# Patient Record
Sex: Female | Born: 1955 | Race: White | Hispanic: No | Marital: Single | Smoking: Former smoker
Health system: Southern US, Community
[De-identification: ages and names within clinical notes are randomized; demographics above are authoritative.]

## PROBLEM LIST (undated history)

## (undated) DIAGNOSIS — E785 Hyperlipidemia, unspecified: Secondary | ICD-10-CM

## (undated) DIAGNOSIS — K219 Gastro-esophageal reflux disease without esophagitis: Secondary | ICD-10-CM

## (undated) DIAGNOSIS — F319 Bipolar disorder, unspecified: Secondary | ICD-10-CM

## (undated) DIAGNOSIS — S069X9A Unspecified intracranial injury with loss of consciousness of unspecified duration, initial encounter: Secondary | ICD-10-CM

## (undated) DIAGNOSIS — S069XAA Unspecified intracranial injury with loss of consciousness status unknown, initial encounter: Secondary | ICD-10-CM

## (undated) DIAGNOSIS — I10 Essential (primary) hypertension: Secondary | ICD-10-CM

## (undated) HISTORY — PX: ABDOMINAL HYSTERECTOMY: SHX81

---

## 2004-10-28 ENCOUNTER — Ambulatory Visit: Payer: Self-pay | Admitting: Internal Medicine

## 2006-02-14 ENCOUNTER — Ambulatory Visit: Payer: Self-pay | Admitting: Internal Medicine

## 2015-06-30 ENCOUNTER — Other Ambulatory Visit: Payer: Self-pay | Admitting: *Deleted

## 2015-06-30 DIAGNOSIS — Z1231 Encounter for screening mammogram for malignant neoplasm of breast: Secondary | ICD-10-CM

## 2015-07-14 ENCOUNTER — Ambulatory Visit: Payer: Self-pay | Attending: Family Medicine

## 2016-03-15 ENCOUNTER — Ambulatory Visit
Admission: RE | Admit: 2016-03-15 | Discharge: 2016-03-15 | Disposition: A | Discharge status: Home / Self Care | Payer: Medicare Other | Source: Ambulatory Visit | Attending: Family Medicine | Admitting: Family Medicine

## 2016-03-15 DIAGNOSIS — Z1231 Encounter for screening mammogram for malignant neoplasm of breast: Secondary | ICD-10-CM | POA: Diagnosis not present

## 2018-08-09 ENCOUNTER — Ambulatory Visit: Payer: Medicare Other | Admitting: Podiatry

## 2020-07-07 ENCOUNTER — Other Ambulatory Visit: Payer: Self-pay | Admitting: Acute Care

## 2020-07-07 DIAGNOSIS — I639 Cerebral infarction, unspecified: Secondary | ICD-10-CM

## 2020-07-22 ENCOUNTER — Ambulatory Visit: Payer: Medicare Other

## 2020-08-06 ENCOUNTER — Ambulatory Visit: Admission: RE | Admit: 2020-08-06 | Payer: Medicare Other | Source: Ambulatory Visit

## 2020-09-07 ENCOUNTER — Ambulatory Visit: Payer: Medicare Other

## 2020-11-09 ENCOUNTER — Emergency Department: Payer: Medicare Other

## 2020-11-09 ENCOUNTER — Encounter: Payer: Self-pay | Admitting: Internal Medicine

## 2020-11-09 ENCOUNTER — Inpatient Hospital Stay
Admission: EM | Admit: 2020-11-09 | Discharge: 2020-11-18 | DRG: 871 | Disposition: A | Discharge status: Skilled Nursing Facility | Payer: Medicare Other | Attending: Internal Medicine | Admitting: Internal Medicine

## 2020-11-09 DIAGNOSIS — Z87820 Personal history of traumatic brain injury: Secondary | ICD-10-CM

## 2020-11-09 DIAGNOSIS — K219 Gastro-esophageal reflux disease without esophagitis: Secondary | ICD-10-CM | POA: Diagnosis present

## 2020-11-09 DIAGNOSIS — E669 Obesity, unspecified: Secondary | ICD-10-CM | POA: Diagnosis present

## 2020-11-09 DIAGNOSIS — F319 Bipolar disorder, unspecified: Secondary | ICD-10-CM | POA: Diagnosis present

## 2020-11-09 DIAGNOSIS — J189 Pneumonia, unspecified organism: Secondary | ICD-10-CM | POA: Diagnosis present

## 2020-11-09 DIAGNOSIS — Z6838 Body mass index (BMI) 38.0-38.9, adult: Secondary | ICD-10-CM

## 2020-11-09 DIAGNOSIS — F419 Anxiety disorder, unspecified: Secondary | ICD-10-CM | POA: Diagnosis present

## 2020-11-09 DIAGNOSIS — S069XAA Unspecified intracranial injury with loss of consciousness status unknown, initial encounter: Secondary | ICD-10-CM | POA: Insufficient documentation

## 2020-11-09 DIAGNOSIS — I10 Essential (primary) hypertension: Secondary | ICD-10-CM | POA: Diagnosis present

## 2020-11-09 DIAGNOSIS — E785 Hyperlipidemia, unspecified: Secondary | ICD-10-CM | POA: Diagnosis present

## 2020-11-09 DIAGNOSIS — A419 Sepsis, unspecified organism: Secondary | ICD-10-CM | POA: Diagnosis present

## 2020-11-09 DIAGNOSIS — R4182 Altered mental status, unspecified: Secondary | ICD-10-CM

## 2020-11-09 DIAGNOSIS — R52 Pain, unspecified: Secondary | ICD-10-CM | POA: Diagnosis present

## 2020-11-09 DIAGNOSIS — N3000 Acute cystitis without hematuria: Secondary | ICD-10-CM

## 2020-11-09 DIAGNOSIS — G40909 Epilepsy, unspecified, not intractable, without status epilepticus: Secondary | ICD-10-CM | POA: Diagnosis present

## 2020-11-09 DIAGNOSIS — Z20822 Contact with and (suspected) exposure to covid-19: Secondary | ICD-10-CM | POA: Diagnosis present

## 2020-11-09 DIAGNOSIS — N390 Urinary tract infection, site not specified: Secondary | ICD-10-CM | POA: Diagnosis present

## 2020-11-09 DIAGNOSIS — S069X9A Unspecified intracranial injury with loss of consciousness of unspecified duration, initial encounter: Secondary | ICD-10-CM | POA: Insufficient documentation

## 2020-11-09 DIAGNOSIS — G9341 Metabolic encephalopathy: Secondary | ICD-10-CM | POA: Diagnosis present

## 2020-11-09 DIAGNOSIS — Z87891 Personal history of nicotine dependence: Secondary | ICD-10-CM

## 2020-11-09 DIAGNOSIS — R569 Unspecified convulsions: Secondary | ICD-10-CM | POA: Diagnosis not present

## 2020-11-09 DIAGNOSIS — J9601 Acute respiratory failure with hypoxia: Secondary | ICD-10-CM | POA: Diagnosis present

## 2020-11-09 DIAGNOSIS — R269 Unspecified abnormalities of gait and mobility: Secondary | ICD-10-CM | POA: Diagnosis present

## 2020-11-09 DIAGNOSIS — D72829 Elevated white blood cell count, unspecified: Secondary | ICD-10-CM

## 2020-11-09 HISTORY — DX: Hyperlipidemia, unspecified: E78.5

## 2020-11-09 HISTORY — DX: Gastro-esophageal reflux disease without esophagitis: K21.9

## 2020-11-09 HISTORY — DX: Unspecified intracranial injury with loss of consciousness of unspecified duration, initial encounter: S06.9X9A

## 2020-11-09 HISTORY — DX: Bipolar disorder, unspecified: F31.9

## 2020-11-09 HISTORY — DX: Unspecified intracranial injury with loss of consciousness status unknown, initial encounter: S06.9XAA

## 2020-11-09 HISTORY — DX: Essential (primary) hypertension: I10

## 2020-11-09 LAB — URINALYSIS, COMPLETE (UACMP) WITH MICROSCOPIC
Bilirubin Urine: NEGATIVE
Glucose, UA: NEGATIVE mg/dL
Ketones, ur: 5 mg/dL — AB
Nitrite: NEGATIVE
Protein, ur: 100 mg/dL — AB
Specific Gravity, Urine: 1.015 (ref 1.005–1.030)
WBC, UA: >50 WBC/hpf — ABNORMAL HIGH (ref 0–5)
pH: 5 (ref 5.0–8.0)

## 2020-11-09 LAB — COMPREHENSIVE METABOLIC PANEL
ALT: 17 U/L (ref 0–44)
AST: 38 U/L (ref 15–41)
Albumin: 3.1 g/dL — ABNORMAL LOW (ref 3.5–5.0)
Alkaline Phosphatase: 51 U/L (ref 38–126)
Anion gap: 10 (ref 5–15)
BUN: 15 mg/dL (ref 8–23)
CO2: 25 mmol/L (ref 22–32)
Calcium: 8.8 mg/dL — ABNORMAL LOW (ref 8.9–10.3)
Chloride: 100 mmol/L (ref 98–111)
Creatinine, Ser: 1.18 mg/dL — ABNORMAL HIGH (ref 0.44–1.00)
GFR, Estimated: 52 mL/min — ABNORMAL LOW (ref >60)
Glucose, Bld: 128 mg/dL — ABNORMAL HIGH (ref 70–99)
Potassium: 3.7 mmol/L (ref 3.5–5.1)
Sodium: 135 mmol/L (ref 135–145)
Total Bilirubin: 0.9 mg/dL (ref 0.3–1.2)
Total Protein: 6.7 g/dL (ref 6.5–8.1)

## 2020-11-09 LAB — APTT: aPTT: 33 seconds (ref 24–36)

## 2020-11-09 LAB — CBC WITH DIFFERENTIAL/PLATELET
Abs Immature Granulocytes: 0.23 10*3/uL — ABNORMAL HIGH (ref 0.00–0.07)
Basophils Absolute: 0.1 10*3/uL (ref 0.0–0.1)
Basophils Relative: 0 %
Eosinophils Absolute: 0 10*3/uL (ref 0.0–0.5)
Eosinophils Relative: 0 %
HCT: 34.9 % — ABNORMAL LOW (ref 36.0–46.0)
Hemoglobin: 11.3 g/dL — ABNORMAL LOW (ref 12.0–15.0)
Immature Granulocytes: 1 %
Lymphocytes Relative: 5 %
Lymphs Abs: 0.9 10*3/uL (ref 0.7–4.0)
MCH: 28.5 pg (ref 26.0–34.0)
MCHC: 32.4 g/dL (ref 30.0–36.0)
MCV: 87.9 fL (ref 80.0–100.0)
Monocytes Absolute: 3.1 10*3/uL — ABNORMAL HIGH (ref 0.1–1.0)
Monocytes Relative: 17 %
Neutro Abs: 14.7 10*3/uL — ABNORMAL HIGH (ref 1.7–7.7)
Neutrophils Relative %: 77 %
Platelets: 211 10*3/uL (ref 150–400)
RBC: 3.97 MIL/uL (ref 3.87–5.11)
RDW: 13.7 % (ref 11.5–15.5)
Smear Review: NORMAL
WBC: 19 10*3/uL — ABNORMAL HIGH (ref 4.0–10.5)
nRBC: 0 % (ref 0.0–0.2)

## 2020-11-09 LAB — RESP PANEL BY RT-PCR (FLU A&B, COVID) ARPGX2
Influenza A by PCR: NEGATIVE
Influenza B by PCR: NEGATIVE
SARS Coronavirus 2 by RT PCR: NEGATIVE

## 2020-11-09 LAB — LACTIC ACID, PLASMA: Lactic Acid, Venous: 1.8 mmol/L (ref 0.5–1.9)

## 2020-11-09 LAB — PROTIME-INR
INR: 1.1 (ref 0.8–1.2)
Prothrombin Time: 14.1 seconds (ref 11.4–15.2)

## 2020-11-09 LAB — BRAIN NATRIURETIC PEPTIDE: B Natriuretic Peptide: 230 pg/mL — ABNORMAL HIGH (ref 0.0–100.0)

## 2020-11-09 MED ORDER — SODIUM CHLORIDE 0.9 % IV SOLN
2.0000 g | Freq: Once | INTRAVENOUS | Status: AC
  Administered 2020-11-09: 2 g via INTRAVENOUS
  Filled 2020-11-09: qty 2

## 2020-11-09 MED ORDER — SODIUM CHLORIDE 0.9 % IV BOLUS (SEPSIS)
1000.0000 mL | Freq: Once | INTRAVENOUS | Status: AC
  Administered 2020-11-09: 1000 mL via INTRAVENOUS

## 2020-11-09 MED ORDER — VITAMIN E 180 MG (400 UNIT) PO CAPS
400.0000 [IU] | ORAL_CAPSULE | Freq: Every day | ORAL | Status: DC
  Administered 2020-11-10 – 2020-11-18 (×9): 400 [IU] via ORAL
  Filled 2020-11-09 (×2): qty 4
  Filled 2020-11-09: qty 1
  Filled 2020-11-09 (×3): qty 4
  Filled 2020-11-09 (×2): qty 1
  Filled 2020-11-09 (×3): qty 4
  Filled 2020-11-09: qty 1

## 2020-11-09 MED ORDER — ENOXAPARIN SODIUM 40 MG/0.4ML ~~LOC~~ SOLN: 40.0000 mg | SUBCUTANEOUS | Status: DC

## 2020-11-09 MED ORDER — PRAVASTATIN SODIUM 40 MG PO TABS
40.0000 mg | ORAL_TABLET | Freq: Every day | ORAL | Status: DC
  Filled 2020-11-09: qty 1

## 2020-11-09 MED ORDER — VANCOMYCIN HCL IN DEXTROSE 1-5 GM/200ML-% IV SOLN
1000.0000 mg | Freq: Once | INTRAVENOUS | Status: DC
  Filled 2020-11-09: qty 200

## 2020-11-09 MED ORDER — THEREMS-M PO TABS: 1.0000 | ORAL_TABLET | Freq: Every day | ORAL | Status: DC

## 2020-11-09 MED ORDER — SODIUM CHLORIDE 0.9 % IV SOLN
500.0000 mg | INTRAVENOUS | Status: DC
  Administered 2020-11-09 – 2020-11-12 (×4): 500 mg via INTRAVENOUS
  Filled 2020-11-09 (×5): qty 500

## 2020-11-09 MED ORDER — SODIUM CHLORIDE 0.9 % IV SOLN
2.0000 g | Freq: Two times a day (BID) | INTRAVENOUS | Status: DC
  Administered 2020-11-10 – 2020-11-13 (×7): 2 g via INTRAVENOUS
  Filled 2020-11-09 (×2): qty 2
  Filled 2020-11-09: qty 1.94
  Filled 2020-11-09 (×6): qty 2

## 2020-11-09 MED ORDER — PRAVASTATIN SODIUM 40 MG PO TABS
40.0000 mg | ORAL_TABLET | Freq: Every day | ORAL | Status: DC
  Administered 2020-11-10 – 2020-11-17 (×8): 40 mg via ORAL
  Filled 2020-11-09 (×8): qty 2

## 2020-11-09 MED ORDER — BENZTROPINE MESYLATE 0.5 MG PO TABS
0.5000 mg | ORAL_TABLET | Freq: Two times a day (BID) | ORAL | Status: DC
  Administered 2020-11-09 – 2020-11-18 (×18): 0.5 mg via ORAL
  Filled 2020-11-09 (×21): qty 1

## 2020-11-09 MED ORDER — SODIUM CHLORIDE 0.9 % IV SOLN: 2.0000 g | Freq: Once | INTRAVENOUS | Status: DC

## 2020-11-09 MED ORDER — PANTOPRAZOLE SODIUM 40 MG PO TBEC
40.0000 mg | DELAYED_RELEASE_TABLET | Freq: Every day | ORAL | Status: DC
  Administered 2020-11-09 – 2020-11-18 (×10): 40 mg via ORAL
  Filled 2020-11-09 (×10): qty 1

## 2020-11-09 MED ORDER — RISPERIDONE 1 MG PO TABS
1.0000 mg | ORAL_TABLET | Freq: Two times a day (BID) | ORAL | Status: DC
  Administered 2020-11-09 – 2020-11-18 (×18): 1 mg via ORAL
  Filled 2020-11-09 (×20): qty 1

## 2020-11-09 MED ORDER — SODIUM CHLORIDE 0.9 % IV BOLUS
1000.0000 mL | Freq: Once | INTRAVENOUS | Status: AC
  Administered 2020-11-09: 1000 mL via INTRAVENOUS

## 2020-11-09 MED ORDER — METRONIDAZOLE IN NACL 5-0.79 MG/ML-% IV SOLN
500.0000 mg | Freq: Once | INTRAVENOUS | Status: AC
  Administered 2020-11-09: 500 mg via INTRAVENOUS
  Filled 2020-11-09: qty 100

## 2020-11-09 MED ORDER — ENOXAPARIN SODIUM 60 MG/0.6ML ~~LOC~~ SOLN
0.5000 mg/kg | SUBCUTANEOUS | Status: DC
  Administered 2020-11-09 – 2020-11-17 (×9): 50 mg via SUBCUTANEOUS
  Filled 2020-11-09 (×9): qty 0.6

## 2020-11-09 MED ORDER — ACETAMINOPHEN 500 MG PO TABS
ORAL_TABLET | ORAL | Status: AC
  Administered 2020-11-09: 1000 mg via ORAL
  Filled 2020-11-09: qty 2

## 2020-11-09 MED ORDER — VANCOMYCIN HCL 2000 MG/400ML IV SOLN
2000.0000 mg | Freq: Once | INTRAVENOUS | Status: DC
  Filled 2020-11-09: qty 400

## 2020-11-09 MED ORDER — VALPROATE SODIUM 100 MG/ML IV SOLN
1000.0000 mg | Freq: Every day | INTRAVENOUS | Status: DC
  Administered 2020-11-09 – 2020-11-10 (×2): 1000 mg via INTRAVENOUS
  Filled 2020-11-09 (×3): qty 10

## 2020-11-09 MED ORDER — VANCOMYCIN HCL IN DEXTROSE 1-5 GM/200ML-% IV SOLN
1000.0000 mg | Freq: Once | INTRAVENOUS | Status: AC
  Administered 2020-11-09: 1000 mg via INTRAVENOUS

## 2020-11-09 MED ORDER — LORAZEPAM 2 MG/ML IJ SOLN
1.0000 mg | INTRAMUSCULAR | Status: DC | PRN
  Administered 2020-11-09: 1 mg via INTRAVENOUS
  Filled 2020-11-09: qty 1

## 2020-11-09 MED ORDER — LORAZEPAM 0.5 MG PO TABS
1.0000 mg | ORAL_TABLET | Freq: Every day | ORAL | Status: DC
  Administered 2020-11-10 – 2020-11-18 (×9): 1 mg via ORAL
  Filled 2020-11-09 (×3): qty 1
  Filled 2020-11-09: qty 2
  Filled 2020-11-09 (×5): qty 1

## 2020-11-09 MED ORDER — ACETAMINOPHEN 325 MG PO TABS
650.0000 mg | ORAL_TABLET | Freq: Four times a day (QID) | ORAL | Status: DC | PRN
  Administered 2020-11-09 (×2): 650 mg via ORAL
  Filled 2020-11-09 (×2): qty 2

## 2020-11-09 MED ORDER — ALBUTEROL SULFATE (2.5 MG/3ML) 0.083% IN NEBU: 2.5000 mg | INHALATION_SOLUTION | RESPIRATORY_TRACT | Status: DC | PRN

## 2020-11-09 MED ORDER — HYDRALAZINE HCL 20 MG/ML IJ SOLN: 5.0000 mg | INTRAMUSCULAR | Status: DC | PRN

## 2020-11-09 MED ORDER — ACETAMINOPHEN 650 MG RE SUPP: 650.0000 mg | Freq: Four times a day (QID) | RECTAL | Status: DC | PRN

## 2020-11-09 MED ORDER — VITAMIN D3 25 MCG (1000 UNIT) PO TABS
2000.0000 [IU] | ORAL_TABLET | Freq: Every day | ORAL | Status: DC
  Administered 2020-11-09 – 2020-11-18 (×10): 2000 [IU] via ORAL
  Filled 2020-11-09 (×20): qty 2

## 2020-11-09 MED ORDER — RISPERIDONE 3 MG PO TABS
4.0000 mg | ORAL_TABLET | Freq: Two times a day (BID) | ORAL | Status: DC
  Administered 2020-11-09 – 2020-11-18 (×17): 4 mg via ORAL
  Filled 2020-11-09 (×10): qty 1
  Filled 2020-11-09: qty 4
  Filled 2020-11-09 (×11): qty 1

## 2020-11-09 MED ORDER — ADULT MULTIVITAMIN W/MINERALS CH
1.0000 | ORAL_TABLET | Freq: Every day | ORAL | Status: DC
  Administered 2020-11-10 – 2020-11-18 (×9): 1 via ORAL
  Filled 2020-11-09 (×9): qty 1

## 2020-11-09 MED ORDER — ONDANSETRON HCL 4 MG/2ML IJ SOLN: 4.0000 mg | Freq: Three times a day (TID) | INTRAMUSCULAR | Status: DC | PRN

## 2020-11-09 MED ORDER — ACETAMINOPHEN 500 MG PO TABS: 1000.0000 mg | ORAL_TABLET | Freq: Once | ORAL | Status: AC

## 2020-11-09 MED ORDER — LORATADINE 10 MG PO TABS
10.0000 mg | ORAL_TABLET | Freq: Every day | ORAL | Status: DC
  Administered 2020-11-09 – 2020-11-18 (×10): 10 mg via ORAL
  Filled 2020-11-09 (×10): qty 1

## 2020-11-09 MED ORDER — VANCOMYCIN HCL 1500 MG/300ML IV SOLN
1500.0000 mg | Freq: Once | INTRAVENOUS | Status: AC
  Administered 2020-11-09: 1500 mg via INTRAVENOUS
  Filled 2020-11-09: qty 300

## 2020-11-09 MED ORDER — LACTATED RINGERS IV SOLN: INTRAVENOUS | Status: DC

## 2020-11-09 MED ORDER — DM-GUAIFENESIN ER 30-600 MG PO TB12
1.0000 | ORAL_TABLET | Freq: Two times a day (BID) | ORAL | Status: DC | PRN
  Administered 2020-11-13: 1 via ORAL
  Filled 2020-11-09: qty 1

## 2020-11-09 NOTE — Consult Note (Signed)
Pharmacy Antibiotic Note  Debra Rich is a 65 y.o. female admitted on 11/09/2020 from group home with altered mental status and fever. Patient with history of cognitive deficiency and seizure-like activity. Patient presented with leukocytosis and fever. Pharmacy has been consulted for cefepime for CAP and UTI.  PCN allergy documentation in chart review: Hives, attempted to clarify with patient if any severe reaction, unable to obtain information from patient due to poor historian, AMS. Tolerated 1st dose of cefepime given in ED  Plan:  Initiate cefepime 2 gram Q12H based on current renal function  Monitor renal function for dose adjustments   Height: 5\' 3"  (160 cm) Weight: 99.8 kg (220 lb) IBW/kg (Calculated) : 52.4  Temp (24hrs), Avg:101.6 F (38.7 C), Min:99.4 F (37.4 C), Max:103 F (39.4 C)  Recent Labs  Lab 11/09/20 1200  WBC 19.0*  CREATININE 1.18*  LATICACIDVEN 1.8    Estimated Creatinine Clearance: 55.7 mL/min (A) (by C-G formula based on SCr of 1.18 mg/dL (H)).    Allergies  Allergen Reactions  . Penicillins     Antimicrobials this admission: 3/14 cefepime >>  3/14 azithromycin >>  3/14 Vancomycin x 1   Dose adjustments this admission: n/a  Microbiology results: 3/14 BCx: sent 3/14 UCx: sent   Thank you for allowing pharmacy to be a part of this patient's care.  4/14, PharmD, BCPS Clinical Pharmacist  11/09/2020 3:59 PM

## 2020-11-09 NOTE — ED Notes (Addendum)
Patient becomes anxious at times and has tremors which affects her vital signs. Patient has tried to get up to use the bathroom, but stays on the stretcher when reminded she has a Purewick. Purewick is in place and urine is noted in the cannister.

## 2020-11-09 NOTE — ED Notes (Signed)
Patient's dinner tray was set up for patient.

## 2020-11-09 NOTE — Progress Notes (Signed)
CODE SEPSIS - PHARMACY COMMUNICATION  **Broad Spectrum Antibiotics should be administered within 1 hour of Sepsis diagnosis**  Time Code Sepsis Called/Page Received: 1206  Antibiotics Ordered: vancomycin, aztreonam, metronidazole   Time of 1st antibiotic administration: 1214  Additional action taken by pharmacy: n/a  If necessary, Name of Provider/Nurse Contacted: n/a    Sharen Hones ,PharmD Clinical Pharmacist  11/09/2020  12:06 PM

## 2020-11-09 NOTE — ED Notes (Signed)
Per group home manager at bedside, states patient ambulates with walker intermittently, patient was able to walk to breakfast this morning without walker but started feeling bad during breakfast, patient ambulated to couch and then became too weak to walk and less responsive. Group home- [facility], contact Tora Perches [phone]

## 2020-11-09 NOTE — ED Notes (Signed)
MD at bedside. 

## 2020-11-09 NOTE — ED Provider Notes (Signed)
[facility] Emergency Department Provider Note  Time seen: 12:12 PM  I have reviewed the triage vital signs and the nursing notes.   HISTORY  Chief Complaint Altered Mental Status and Fever   HPI Jimmy Stipes is a 65 y.o. female with no known past medical history from a group home presents to the emergency department for confusion and fever.  According to report patient was noted to be confused and sluggish today found to be febrile to 103.  Here patient is awake and alert.  She is oriented to person and place but stated the year of 2002.  Patient's responses are somewhat slow/sluggish but overall accurate.  Patient denies any headache nausea vomiting diarrhea, dysuria, cough or congestion.  Patient states she has been vaccinated against COVID.  Denies any chest or abdominal pain.   No past medical history on file.  There are no problems to display for this patient.   No past surgical history on file.  Prior to Admission medications   Not on File    Allergies  Allergen Reactions  . Penicillins     Family History  Problem Relation Age of Onset  . Breast cancer Neg Hx     Social History    Review of Systems Constitutional: Positive for fever, patient believes x3 days. Cardiovascular: Negative for chest pain. Respiratory: Negative for shortness of breath.  Negative for cough. Gastrointestinal: Negative for abdominal pain, vomiting and diarrhea. Genitourinary: Negative for urinary compaints Musculoskeletal: Negative for musculoskeletal complaints Skin: Negative for skin complaints  Neurological: Negative for headache All other ROS negative  ____________________________________________   PHYSICAL EXAM:  VITAL SIGNS: ED Triage Vitals  Enc Vitals Group     BP 11/09/20 1155 (!) 140/110     Pulse Rate 11/09/20 1155 (!) 105     Resp 11/09/20 1157 (!) 24     Temp 11/09/20 1155 (!) 103 F (39.4 C)     Temp Source 11/09/20 1155 Oral     SpO2  11/09/20 1155 92 %     Weight 11/09/20 1155 220 lb (99.8 kg)     Height 11/09/20 1155 5\' 3"  (1.6 m)     Head Circumference --      Peak Flow --      Pain Score 11/09/20 1155 0     Pain Loc --      Pain Edu? --      Excl. in GC? --     Constitutional: Patient is awake and alert, no acute distress.  Oriented to person and place only.  Somewhat slowed but overall accurate responses. Eyes: Normal exam ENT      Head: Normocephalic and atraumatic.      Mouth/Throat: Dry mucous membranes. Cardiovascular: Normal rate, regular rhythm. No murmur Respiratory: Normal respiratory effort without tachypnea nor retractions. Breath sounds are clear Gastrointestinal: Soft and nontender. No distention.   Musculoskeletal: Nontender with normal range of motion in all extremities.  Neurologic:  Normal speech and language. No gross focal neurologic deficits  Skin:  Skin is warm, dry and intact.  Psychiatric: Mood and affect are normal.   ____________________________________________    EKG  EKG viewed and interpreted by myself appears to show a sinus rhythm 100 bpm with a narrow QRS, normal axis, nonspecific ST changes.  ____________________________________________    RADIOLOGY  IMPRESSION:  1. Suspect heterogeneous airspace opacity at the left lung base,  concerning for infection. PA and lateral radiographs may be helpful  to further evaluate.  2.  Mild cardiomegaly.   ____________________________________________   INITIAL IMPRESSION / ASSESSMENT AND PLAN / ED COURSE  Pertinent labs & imaging results that were available during my care of the patient were reviewed by me and considered in my medical decision making (see chart for details).   Patient presents emergency department for altered mental status confusion found to be febrile to 103.  Differential is quite broad at this time with an overall negative review of systems.  We will check labs, cultures, start broad-spectrum antibiotics, IV  hydrate and treat with Tylenol.  We will obtain Covid swabs addition to lab work and a chest x-ray.  Patient's labs show significant leukocytosis along with her borderline hypoxia tachypnea and tachycardia initially meeting sepsis criteria.  Patient receiving broad-spectrum antibiotics.  Chest x-ray shows left lung base pneumonia and urinalysis consistent with UTI.  Given the patient's confusion fever and sepsis criteria we will admit to the hospital service for continued IV antibiotics.  Patient agreeable to plan of care.  Jimma Ortman was evaluated in Emergency Department on 11/09/2020 for the symptoms described in the history of present illness. She was evaluated in the context of the global COVID-19 pandemic, which necessitated consideration that the patient might be at risk for infection with the SARS-CoV-2 virus that causes COVID-19. Institutional protocols and algorithms that pertain to the evaluation of patients at risk for COVID-19 are in a state of rapid change based on information released by regulatory bodies including the CDC and federal and state organizations. These policies and algorithms were followed during the patient's care in the ED.  CRITICAL CARE Performed by: Minna Antis   Total critical care time: 30 minutes  Critical care time was exclusive of separately billable procedures and treating other patients.  Critical care was necessary to treat or prevent imminent or life-threatening deterioration.  Critical care was time spent personally by me on the following activities: development of treatment plan with patient and/or surrogate as well as nursing, discussions with consultants, evaluation of patient's response to treatment, examination of patient, obtaining history from patient or surrogate, ordering and performing treatments and interventions, ordering and review of laboratory studies, ordering and review of radiographic studies, pulse oximetry and re-evaluation of  patient's condition.  ____________________________________________   FINAL CLINICAL IMPRESSION(S) / ED DIAGNOSES  Sepsis   Minna Antis, MD 11/09/20 1454

## 2020-11-09 NOTE — H&P (Signed)
History and Physical    Celes Dedic XLK:[id number] DOB: Jan 04, 1956 DOA: 11/09/2020  Referring MD/NP/PA:   PCP: System, [name]   Patient coming from:  The patient is coming from group home.   Chief Complaint: AMS and fever  HPI: Landi Biscardi is a 65 y.o. female with medical history significant of TBI, seizure-like activity (per Dr. Malvin Johns of neurology clinic note on 07/17/20), hypertension, bipolar disorder hyperlipidemia, GERD, anxiety, who presents with altered mental status and fever.  Per report, pt was noted to be more confused when he was sitting down on couch post breakfast. Patient would not speak to staff. Patient was noted to be febrile at 103 F.  Her mental status has been improving in ED. When I saw patient in the ED, patient is alert, mildly confused, but is orientated x3.  She was able to tell me some of family medical history.  She moves all extremities normally.  No facial droop or slurred speech. She denies chest pain, cough, shortness breath.  No nausea vomiting, diarrhea, abdominal pain, symptoms of UTI. She has fine tremor in both hands.  ED Course: pt was found to have WBC 19.0, negative Covid PCR, lactic acid of 1.8, INR 1.1, positive urinalysis (cloudy appearance, moderate amount of leukocyte, rare bacteria, WBC> 50), mild AKI with creatinine 1.08, BUN 15, GFR 52, temperature 103, blood pressure 129/81, heart rate 105, 32, oxygen desaturated to 89% on room air and 96% on 2 L oxygen. chest x-ray showed left basilar infiltration.  Patient is admitted to MedSurg bed as inpatient.  Review of Systems:   General: has fevers, no chills, no body weight gain, has fatigue HEENT: no blurry vision, hearing changes or sore throat Respiratory: no dyspnea, coughing, wheezing CV: no chest pain, no palpitations GI: no nausea, vomiting, abdominal pain, diarrhea, constipation GU: no dysuria, burning on urination, increased urinary frequency, hematuria  Ext: has leg edema Neuro:  no unilateral weakness, numbness, or tingling, no vision change or hearing loss. Has confusion.  Has hand tremor Skin: no rash, no skin tear. MSK: No muscle spasm, no deformity, no limitation of range of movement in spin Heme: No easy bruising.  Travel history: No recent long distant travel.  Allergy:  Allergies  Allergen Reactions  . Penicillins     Past Medical History:  Diagnosis Date  . Bipolar 1 disorder (HCC)   . GERD (gastroesophageal reflux disease)   . HLD (hyperlipidemia)   . HTN (hypertension)   . TBI (traumatic brain injury) [facility])     Past Surgical History:  Procedure Laterality Date  . ABDOMINAL HYSTERECTOMY     pt reported, but per group home manager, this may not be accurate.    Social History:  reports that she has quit smoking. She has never used smokeless tobacco. She reports that she does not drink alcohol and does not use drugs.  Family History:  Per patient's report.  Family History  Problem Relation Age of Onset  . Diabetes Mellitus II Mother   . Hypertension Mother   . Emphysema Father   . Breast cancer Neg Hx      Prior to Admission medications   Not on File    Physical Exam: Vitals:   11/09/20 1400 11/09/20 1412 11/09/20 1430 11/09/20 1431  BP: 101/67  129/81   Pulse: 88  81   Resp: (!) 29  (!) 25   Temp:  99.4 F (37.4 C)    TempSrc:  Oral    SpO2: 91%  90% (!) 89%  Weight:      Height:       General: Not in acute distress HEENT:       Eyes: PERRL, EOMI, no scleral icterus.       ENT: No discharge from the ears and nose, no pharynx injection, no tonsillar enlargement.        Neck: No JVD, no bruit, no mass felt. Heme: No neck lymph node enlargement. Cardiac: S1/S2, RRR, No murmurs, No gallops or rubs. Respiratory: Has coarse breathing sound bilaterally GI: Soft, nondistended, nontender, no rebound pain, no organomegaly, BS present. GU: No hematuria Ext: has trace leg edema bilaterally. 1+DP/PT pulse  bilaterally. Musculoskeletal: No joint deformities, No joint redness or warmth, no limitation of ROM in spin. Skin: No rashes.  Neuro: Currently mildly confused, but is orientated x3, cranial nerves II-XII grossly intact, moves all extremities normally.  Psych: Patient is not psychotic, no suicidal or hemocidal ideation.  Labs on Admission: I have personally reviewed following labs and imaging studies  CBC: Recent Labs  Lab 11/09/20 1200  WBC 19.0*  NEUTROABS 14.7*  HGB 11.3*  HCT 34.9*  MCV 87.9  PLT 211   Basic Metabolic Panel: Recent Labs  Lab 11/09/20 1200  NA 135  K 3.7  CL 100  CO2 25  GLUCOSE 128*  BUN 15  CREATININE 1.18*  CALCIUM 8.8*   GFR: Estimated Creatinine Clearance: 55.7 mL/min (A) (by C-G formula based on SCr of 1.18 mg/dL (H)). Liver Function Tests: Recent Labs  Lab 11/09/20 1200  AST 38  ALT 17  ALKPHOS 51  BILITOT 0.9  PROT 6.7  ALBUMIN 3.1*   No results for input(s): LIPASE, AMYLASE in the last 168 hours. No results for input(s): AMMONIA in the last 168 hours. Coagulation Profile: Recent Labs  Lab 11/09/20 1200  INR 1.1   Cardiac Enzymes: No results for input(s): CKTOTAL, CKMB, CKMBINDEX, TROPONINI in the last 168 hours. BNP (last 3 results) No results for input(s): PROBNP in the last 8760 hours. HbA1C: No results for input(s): HGBA1C in the last 72 hours. CBG: No results for input(s): GLUCAP in the last 168 hours. Lipid Profile: No results for input(s): CHOL, HDL, LDLCALC, TRIG, CHOLHDL, LDLDIRECT in the last 72 hours. Thyroid Function Tests: No results for input(s): TSH, T4TOTAL, FREET4, T3FREE, THYROIDAB in the last 72 hours. Anemia Panel: No results for input(s): VITAMINB12, FOLATE, FERRITIN, TIBC, IRON, RETICCTPCT in the last 72 hours. Urine analysis:    Component Value Date/Time   COLORURINE YELLOW (A) 11/09/2020 1159   APPEARANCEUR CLOUDY (A) 11/09/2020 1159   LABSPEC 1.015 11/09/2020 1159   PHURINE 5.0 11/09/2020  1159   GLUCOSEU NEGATIVE 11/09/2020 1159   HGBUR MODERATE (A) 11/09/2020 1159   BILIRUBINUR NEGATIVE 11/09/2020 1159   KETONESUR 5 (A) 11/09/2020 1159   PROTEINUR 100 (A) 11/09/2020 1159   NITRITE NEGATIVE 11/09/2020 1159   LEUKOCYTESUR MODERATE (A) 11/09/2020 1159   Sepsis Labs: @LABRCNTIP (procalcitonin:4,lacticidven:4) ) Recent Results (from the past 240 hour(s))  Resp Panel by RT-PCR (Flu A&B, Covid) Nasopharyngeal Swab     Status: None   Collection Time: 11/09/20 12:23 PM   Specimen: Nasopharyngeal Swab; [name]    Comment: (NOTE) SARS-CoV-2 target nucleic acids are NOT DETECTED.  The SARS-CoV-2 RNA is generally detectable in upper respiratory specimens during the acute phase of infection. The lowest concentration of SARS-CoV-2 viral  copies this assay can detect is 138 copies/mL. A negative result does not preclude SARS-Cov-2 infection and should not be used as the sole basis for treatment or other patient management decisions. A negative result may occur with  improper specimen collection/handling, submission of specimen other than nasopharyngeal swab, presence of viral mutation(s) within the areas targeted by this assay, and inadequate number of viral copies(<138 copies/mL). A negative result must be combined with clinical observations, patient history, and epidemiological information. The expected result is Negative.  Fact Sheet for Patients:  BloggerCourse.com  Fact Sheet for Healthcare Providers:  SeriousBroker.it  This test is no t yet approved or cleared by the [place] FDA and  has been authorized for detection and/or diagnosis of SARS-CoV-2 by FDA under an Emergency Use Authorization (EUA). This EUA will remain  in effect (meaning this test can be used) for the duration of  the COVID-19 declaration under Section 564(b)(1) of the Act, 21 U.S.C.section 360bbb-3(b)(1), unless the authorization is terminated  or revoked sooner.       Influenza A by PCR NEGATIVE NEGATIVE Final   Influenza B by PCR NEGATIVE NEGATIVE Final    Comment: (NOTE) The Xpert Xpress SARS-CoV-2/FLU/RSV plus assay is intended as an aid in the diagnosis of influenza from Nasopharyngeal swab specimens and should not be used as a sole basis for treatment. Nasal washings and aspirates are unacceptable for Xpert Xpress SARS-CoV-2/FLU/RSV testing.  Fact Sheet for Patients: BloggerCourse.com  Fact Sheet for Healthcare Providers: SeriousBroker.it  This test is not yet approved or cleared by the [place] FDA and has been authorized for detection and/or diagnosis of SARS-CoV-2 by FDA under an Emergency Use Authorization (EUA). This EUA will remain in effect (meaning this test can be used) for the duration of the COVID-19 declaration under Section 564(b)(1) of the Act, 21 U.S.C. section 360bbb-3(b)(1), unless the authorization is terminated or revoked.  Performed at [facility], [address], [place], [place] [address]      Radiological Exams on Admission: DG [facility] View  Result Date: 11/09/2020 CLINICAL DATA:  Suspected sepsis EXAM: PORTABLE CHEST 1 VIEW COMPARISON:  None. FINDINGS: Mild cardiomegaly. Suspect heterogeneous airspace opacity at the left lung base with loss of diaphragmatic distinction. The visualized skeletal structures are unremarkable. IMPRESSION: 1. Suspect heterogeneous airspace opacity at the left lung base, concerning for infection. PA and lateral radiographs may be helpful to further evaluate. 2. Mild cardiomegaly. Electronically Signed   By: Lauralyn Primes M.D.   On: 11/09/2020 12:23     EKG: I have personally reviewed.  Seems to be sinus rhythm with artificial reflux, QTC 451, low voltage, poor  R wave progression  Assessment/Plan Principal Problem:   CAP (community acquired pneumonia) Active Problems:   HTN (hypertension)   HLD (hyperlipidemia)   Seizure-like activity (HCC)   GERD (gastroesophageal reflux disease)   Sepsis (HCC)   Acute metabolic encephalopathy   Acute respiratory failure with hypoxia (HCC)   UTI (urinary tract infection)  Sepsis due to CAP and UTI: Patient meets criteria for sepsis with leukocytosis with WBC 19.0, tachycardia with heart rate of 105, tachypnea with RR 32 and a fever of 103.  Lactic acid is normal.  Currently hemodynamically stable  -Admitted to MedSurg bed for inpatient - IV cefepime and azithromycin (patient received 1 dose of vancomycin and Flagyl in ED) - Mucinex for cough  - Bronchodilators - Urine legionella and S. pneumococcal antigen - Follow up blood culture x2, sputum culture, urine culture -  will get Procalcitonin - IVF: 2L of NS bolus in ED, followed by 75 mL per hour of NS   Acute respiratory failure with hypoxia due to CAP. -Bronchodilators -Nasal cannula oxygen to maintain oxygen saturation above 93%  HTN (hypertension) -hold oral blood pressure medications (amlodipine, Cozaar) since patient is at high risk of developing hypotension due to sepsis -IV hydralazine as needed  HLD (hyperlipidemia) -Pravastatin  Seizure-like activity (HCC) -On Depakote, will switch to IV until mental status, back to normal  GERD (gastroesophageal reflux disease) -Protonix  Acute metabolic encephalopathy: Most likely due to sepsis and UTI -Frequent neuro check    DVT ppx:   SQ Lovenox Code Status: Full code per group home manager Family Communication: I spoke with group home manager Disposition Plan:  Anticipate discharge back to previous environment Consults called: None Admission status and Level of care: Med-Surg:   as inpt       Status is: Inpatient  Remains inpatient appropriate because:Inpatient level of care  appropriate due to severity of illness   Dispo: The patient is from: Group home              Anticipated d/c is to: Group home              Patient currently is not medically stable to d/c.   Difficult to place patient No         Date of Service 11/09/2020    Lorretta Harp [facility]   If 7PM-7AM, please contact night-coverage [email or web address] 11/09/2020, 4:30 PM

## 2020-11-09 NOTE — ED Notes (Signed)
Purewick was replaced after patient was incontinent to urine. Patient was transferred to a hospital bed for comfort and safety. Bed alarm is on. Patient was hot to the touch. Oral temp was 102.

## 2020-11-09 NOTE — ED Notes (Signed)
Patient was assisted to room commode. Patient was already incontinent to urine. Patient was cleaned, wet clothes were removed and ED briefs with a pad was put on patient per her request over a diaper. Patient was assisted back to the stretcher. Patient was unable to get back on the stretcher independently and was held up by this Clinical research associate. A call was made for assistance and patient was helped back on the stretcher. Purewick was placed. Patient was oriented to the call bell again.

## 2020-11-09 NOTE — ED Notes (Signed)
Report given to Zach RN 

## 2020-11-09 NOTE — Consult Note (Signed)
PHARMACY -  BRIEF ANTIBIOTIC NOTE   Pharmacy has received consult(s) for aztreonam and vancomycin from an ED provider.  The patient's profile has been reviewed for ht/wt/allergies/indication/available labs.    After review of PCN allergy severity, aztreonam changed to cefepime  One time order(s) placed for   Vancomycin 2500 mg   Cefepime 2 gram   Further antibiotics/pharmacy consults should be ordered by admitting physician if indicated.                       Thank you, Sharen Hones, PharmD, BCPS Clinical Pharmacist  11/09/2020  1:18 PM

## 2020-11-09 NOTE — Progress Notes (Signed)
PHARMACIST - PHYSICIAN COMMUNICATION  CONCERNING:  Enoxaparin (Lovenox) for DVT Prophylaxis    RECOMMENDATION: Patient was prescribed enoxaprin 40mg  q24 hours for VTE prophylaxis.   Filed Weights   11/09/20 1155  Weight: 99.8 kg (220 lb)    Body mass index is 38.97 kg/m.  Estimated Creatinine Clearance: 55.7 mL/min (A) (by C-G formula based on SCr of 1.18 mg/dL (H)).   Based on [facility] policy patient is candidate for enoxaparin 0.5mg /kg TBW SQ every 24 hours based on BMI being >30.  DESCRIPTION: Pharmacy has adjusted enoxaparin dose per [facility] policy.  Patient is now receiving enoxaparin 50 mg every 24 hours    [facility], PharmD Clinical Pharmacist  11/09/2020 6:46 PM

## 2020-11-09 NOTE — ED Notes (Signed)
Patient is able to feed self.

## 2020-11-09 NOTE — ED Triage Notes (Signed)
Patient presents to ER from group home. Per staff patient sat down on couch post breakfast and slowly became weak/altered. Patient would not speak to staff so they called EMS. Per EMS patient A&OX2 in route to ER. Patient noted to be febrile at 103 F. Patient A&OX2 on arrival to ER.

## 2020-11-09 NOTE — ED Notes (Signed)
Dr. Clyde Lundborg at bedside. Patient is alert, calm and cooperative. Patient is oriented to self and date, place.

## 2020-11-09 NOTE — Progress Notes (Signed)
Following for code sepsis 

## 2020-11-09 NOTE — ED Notes (Signed)
Patient ambulated to and from toilet with assistance, patient needs constant directions but did not appear to have weakness in legs.

## 2020-11-09 NOTE — ED Notes (Signed)
Patient was repositioned on the stretcher. Patient had called out that she needed to void. Urine was noted in the cannister on the wall. Two ED techs repositioned the patient on the stretcher and Purewick was checked and was in place.

## 2020-11-10 ENCOUNTER — Other Ambulatory Visit: Payer: Self-pay

## 2020-11-10 DIAGNOSIS — G9341 Metabolic encephalopathy: Secondary | ICD-10-CM | POA: Diagnosis not present

## 2020-11-10 DIAGNOSIS — J9601 Acute respiratory failure with hypoxia: Secondary | ICD-10-CM | POA: Diagnosis not present

## 2020-11-10 DIAGNOSIS — J189 Pneumonia, unspecified organism: Secondary | ICD-10-CM | POA: Diagnosis not present

## 2020-11-10 LAB — CBC
HCT: 32.7 % — ABNORMAL LOW (ref 36.0–46.0)
Hemoglobin: 10.3 g/dL — ABNORMAL LOW (ref 12.0–15.0)
MCH: 28.3 pg (ref 26.0–34.0)
MCHC: 31.5 g/dL (ref 30.0–36.0)
MCV: 89.8 fL (ref 80.0–100.0)
Platelets: 180 10*3/uL (ref 150–400)
RBC: 3.64 MIL/uL — ABNORMAL LOW (ref 3.87–5.11)
RDW: 14.1 % (ref 11.5–15.5)
WBC: 19 10*3/uL — ABNORMAL HIGH (ref 4.0–10.5)
nRBC: 0 % (ref 0.0–0.2)

## 2020-11-10 LAB — BASIC METABOLIC PANEL
Anion gap: 7 (ref 5–15)
BUN: 15 mg/dL (ref 8–23)
CO2: 25 mmol/L (ref 22–32)
Calcium: 8.2 mg/dL — ABNORMAL LOW (ref 8.9–10.3)
Chloride: 104 mmol/L (ref 98–111)
Creatinine, Ser: 0.97 mg/dL (ref 0.44–1.00)
GFR, Estimated: >60 mL/min (ref >60)
Glucose, Bld: 105 mg/dL — ABNORMAL HIGH (ref 70–99)
Potassium: 3.8 mmol/L (ref 3.5–5.1)
Sodium: 136 mmol/L (ref 135–145)

## 2020-11-10 LAB — HIV ANTIBODY (ROUTINE TESTING W REFLEX): HIV Screen 4th Generation wRfx: NONREACTIVE

## 2020-11-10 LAB — STREP PNEUMONIAE URINARY ANTIGEN: Strep Pneumo Urinary Antigen: NEGATIVE

## 2020-11-10 MED ORDER — ACETAMINOPHEN 500 MG PO TABS
1000.0000 mg | ORAL_TABLET | Freq: Four times a day (QID) | ORAL | Status: DC | PRN
  Administered 2020-11-10 – 2020-11-12 (×6): 1000 mg via ORAL
  Filled 2020-11-10 (×6): qty 2

## 2020-11-10 NOTE — Progress Notes (Signed)
11/09/20 2341 11/09/20 2359 11/10/20 0047  Assess: MEWS Score  Temp (!) 102.3 F (39.1 C)  --   --   BP 132/60  --   --   Pulse Rate 97  --   --   Resp (!) 22  --   --   Level of Consciousness  --   --   --   SpO2 97 %  --   --   O2 Device Nasal Cannula  --   --   Assess: MEWS Score  MEWS Temp 2  --   --   MEWS Systolic 0  --   --   MEWS Pulse 0  --   --   MEWS RR 1  --   --   MEWS LOC 0  --   --   MEWS Score 3  --   --   MEWS Score Color Yellow  --   --   Assess: if the MEWS score is Yellow or Red  Were vital signs taken at a resting state?  --  Yes  --   Focused Assessment  --  No change from prior assessment  --   Early Detection of Sepsis Score *See Row Information*  --  High  --   MEWS guidelines implemented *See Row Information*  --  No, previously yellow, continue vital signs every 4 hours  --   Treat  MEWS Interventions  --  Administered prn meds/treatments (PRN Tylenol)  --   Pain Scale  --  0-10  --   Pain Score  --  0  --   Patients response to intervention  --   --  Unchanged  Notify: Charge Nurse/RN  Name of Charge Nurse/RN Notified  --  Chiquita Loth, RN  --   Date Charge Nurse/RN Notified  --  11/10/20  --   Time Charge Nurse/RN Notified  --  0000  --   Document  Patient Outcome  --   --   --     11/10/20 0056 11/10/20 0100 11/10/20 0237  Assess: MEWS Score  Temp (!) 102.7 F (39.3 C)  --  (!) 100.9 F (38.3 C)  BP  --   --   --   Pulse Rate  --   --   --   Resp  --   --   --   Level of Consciousness  --  Alert  --   SpO2  --   --   --   O2 Device  --   --   --   Assess: MEWS Score  MEWS Temp 2 2 1   MEWS Systolic 0 0 0  MEWS Pulse 0 0 0  MEWS RR 1 1 1   MEWS LOC 0 0 0  MEWS Score 3 3 2   MEWS Score Color Yellow Yellow Yellow  Assess: if the MEWS score is Yellow or Red  Were vital signs taken at a resting state?  --   --   --   Focused Assessment  --   --   --   Early Detection of Sepsis Score *See Row Information*  --   --   --   MEWS  guidelines implemented *See Row Information*  --   --   --   Treat  MEWS Interventions  --   --   --   Pain Scale  --   --   --   Pain Score  --   --   --  Patients response to intervention  --   --   --   Notify: Charge Nurse/RN  Name of Charge Nurse/RN Notified  --   --   --   Date Charge Nurse/RN Notified  --   --   --   Time Charge Nurse/RN Notified  --   --   --   Document  Patient Outcome  --   --   --     11/10/20 0439 11/10/20 0548 11/10/20 0605  Assess: MEWS Score  Temp (!) 101.2 F (38.4 C) (!) 102.7 F (39.3 C)  --   BP (!) 110/49  --   --   Pulse Rate 89  --   --   Resp (!) 22  --   --   Level of Consciousness  --   --   --   SpO2 95 %  --   --   O2 Device Nasal Cannula  --   --   Assess: MEWS Score  MEWS Temp 1 2  --   MEWS Systolic 0 0  --   MEWS Pulse 0 0  --   MEWS RR 1 1  --   MEWS LOC 0 0  --   MEWS Score 2 3  --   MEWS Score Color Yellow Yellow  --   Assess: if the MEWS score is Yellow or Red  Were vital signs taken at a resting state?  --   --   --   Focused Assessment  --   --   --   Early Detection of Sepsis Score *See Row Information*  --   --   --   MEWS guidelines implemented *See Row Information*  --   --   --   Treat  MEWS Interventions  --   --   --   Pain Scale  --   --   --   Pain Score  --   --   --   Patients response to intervention  --   --  Increased  Notify: Charge Nurse/RN  Name of Charge Nurse/RN Notified  --   --   --   Date Charge Nurse/RN Notified  --   --   --   Time Charge Nurse/RN Notified  --   --   --   Document  Patient Outcome  --   --  Other (Comment) (Patient still has elevated temp. Will continue to monitor per NP)

## 2020-11-10 NOTE — Progress Notes (Signed)
PROGRESS NOTE    Debra Rich  ZBT:[id number] DOB: 05-Aug-1956 DOA: 11/09/2020 PCP: System, [name]   Chief complaint.  Altered mental status and fever. Brief Narrative:  Debra Rich is a 65 y.o. female with medical history significant of TBI, seizure-like activity (per Dr. Malvin Johns of neurology clinic note on 07/17/20), hypertension, bipolar disorder hyperlipidemia, GERD, anxiety, who presents with altered mental status and fever. Upon arriving the emergency room, patient met sepsis criteria with leukocytosis, tachycardia, tachypnea and mild hypoxemia.  Urine study was abnormal, chest x-ray showed left basilar infiltrates. Patient is started on with cefepime for UTI and pneumonia, she was also put down zithromax.   Assessment & Plan:   Principal Problem:   CAP (community acquired pneumonia) Active Problems:   HTN (hypertension)   HLD (hyperlipidemia)   Seizure-like activity (HCC)   GERD (gastroesophageal reflux disease)   Sepsis (HCC)   Acute metabolic encephalopathy   Acute respiratory failure with hypoxia (HCC)   UTI (urinary tract infection)  #1.  Sepsis.  Secondary to pneumonia and UTI. Left lower lobe pneumonia. Urinary tract infection. Acute metabolic encephalopathy. Traumatic brain injury. Patient is still confused today, sleepy.  Able to eat. Continue cefepime at the distal max for now. Pending culture results.  2.  Acute hypoxemic respiratory failure secondary to pneumonia. Continue oxygen.  #3.  Seizure disorder. Continues on home medicines.    DVT prophylaxis: Lovenox Code Status: Full Family Communication:  Disposition Plan:  .   Status is: Inpatient  Remains inpatient appropriate because:Inpatient level of care appropriate due to severity of illness   Dispo: The patient is from: Group home              Anticipated d/c is to: Group home              Patient currently is not medically stable to d/c.   Difficult to place patient No         I/O last 3 completed shifts: In: 1122.7 [I.V.:663.8; IV Piggyback:459] Out: -  No intake/output data recorded.     Consultants:   None  Procedures: None  Antimicrobials:  Cefepime and Zithromax.  Subjective: Patient is totally confused, she is not talking today.  She has low-grade fever up temperature 100.8. She has a cough, she is on 2 L oxygen. She has no abdominal pain nausea vomiting.   Objective: Vitals:   11/10/20 0800 11/10/20 1000 11/10/20 1149 11/10/20 1224  BP: (!) 111/49  121/62 112/75  Pulse: 75  88 90  Resp: (!) 26  18 (!) 26  Temp: 99.8 F (37.7 C) 98.3 F (36.8 C) (!) 100.8 F (38.2 C) 97.6 F (36.4 C)  TempSrc: Oral Oral Axillary Oral  SpO2: 93%  94% 98%  Weight:      Height:        Intake/Output Summary (Last 24 hours) at 11/10/2020 1236 Last data filed at 11/10/2020 0400 Gross per 24 hour  Intake 1122.71 ml  Output -  Net 1122.71 ml   Filed Weights   11/09/20 1155  Weight: 99.8 kg    Examination:  General exam: Appears calm and comfortable  Respiratory system: Clear to auscultation. Respiratory effort normal. Cardiovascular system: S1 & S2 heard, RRR. No JVD, murmurs, rubs, gallops or clicks. No pedal edema. Gastrointestinal system: Abdomen is nondistended, soft and nontender. No organomegaly or masses felt. Normal bowel sounds heard. Central nervous system: Drowsy and oriented x1. No focal neurological deficits. Extremities: Symmetric 5 x 5 power. Skin: No  rashes, lesions or ulcers      Data Reviewed: I have personally reviewed following labs and imaging studies  CBC: Recent Labs  Lab 11/09/20 1200 11/10/20 0526  WBC 19.0* 19.0*  NEUTROABS 14.7*  --   HGB 11.3* 10.3*  HCT 34.9* 32.7*  MCV 87.9 89.8  PLT 211 180   Basic Metabolic Panel: Recent Labs  Lab 11/09/20 1200 11/10/20 0526  NA 135 136  K 3.7 3.8  CL 100 104  CO2 25 25  GLUCOSE 128* 105*  BUN 15 15  CREATININE 1.18* 0.97  CALCIUM 8.8* 8.2*    GFR: Estimated Creatinine Clearance: 67.8 mL/min (by C-G formula based on SCr of 0.97 mg/dL). Liver Function Tests: Recent Labs  Lab 11/09/20 1200  AST 38  ALT 17  ALKPHOS 51  BILITOT 0.9  PROT 6.7  ALBUMIN 3.1*   No results for input(s): LIPASE, AMYLASE in the last 168 hours. No results for input(s): AMMONIA in the last 168 hours. Coagulation Profile: Recent Labs  Lab 11/09/20 1200  INR 1.1   Cardiac Enzymes: No results for input(s): CKTOTAL, CKMB, CKMBINDEX, TROPONINI in the last 168 hours. BNP (last 3 results) No results for input(s): PROBNP in the last 8760 hours. HbA1C: No results for input(s): HGBA1C in the last 72 hours. CBG: No results for input(s): GLUCAP in the last 168 hours. Lipid Profile: No results for input(s): CHOL, HDL, LDLCALC, TRIG, CHOLHDL, LDLDIRECT in the last 72 hours. Thyroid Function Tests: No results for input(s): TSH, T4TOTAL, FREET4, T3FREE, THYROIDAB in the last 72 hours. Anemia Panel: No results for input(s): VITAMINB12, FOLATE, FERRITIN, TIBC, IRON, RETICCTPCT in the last 72 hours. Sepsis Labs: Recent Labs  Lab 11/09/20 1200  LATICACIDVEN 1.8    Recent Results (from the past 240 hour(s))  Culture, blood (Routine x 2)     Status: None (Preliminary result)   Collection Time: 11/09/20 12:00 PM   Specimen: BLOOD  Result Value Ref Range Status   Specimen Description BLOOD LEFT ANTECUBITAL  Final   Special Requests   Final    BOTTLES DRAWN AEROBIC AND ANAEROBIC Blood Culture adequate volume   Culture   Final    NO GROWTH < 24 HOURS Performed at [facility], [address], [place], [place] [address]    Report Status PENDING  Incomplete  Culture, blood (Routine x 2)     Status: None (Preliminary result)   Collection Time: 11/09/20 12:00 PM   Specimen: BLOOD  Result Value Ref Range Status   Specimen Description BLOOD RIGHT ANTECUBITAL  Final   Special Requests   Final    BOTTLES DRAWN AEROBIC AND ANAEROBIC Blood  Culture adequate volume   Culture   Final    NO GROWTH < 24 HOURS Performed at [facility], [address], [place], [place] [address]    Report Status PENDING  Incomplete  Resp Panel by RT-PCR (Flu A&B, Covid) Nasopharyngeal Swab     Status: None   Collection Time: 11/09/20 12:23 PM   Specimen: Nasopharyngeal Swab; [name]    Comment: (NOTE) SARS-CoV-2 target nucleic acids are NOT DETECTED.  The SARS-CoV-2 RNA is generally detectable in upper respiratory specimens during the acute phase of infection. The lowest concentration of SARS-CoV-2 viral copies this assay can detect is 138 copies/mL. A negative result does not preclude SARS-Cov-2 infection and should not be used as  the sole basis for treatment or other patient management decisions. A negative result may occur with  improper specimen collection/handling, submission of specimen other than nasopharyngeal swab, presence of viral mutation(s) within the areas targeted by this assay, and inadequate number of viral copies(<138 copies/mL). A negative result must be combined with clinical observations, patient history, and epidemiological information. The expected result is Negative.  Fact Sheet for Patients:  BloggerCourse.com  Fact Sheet for Healthcare Providers:  SeriousBroker.it  This test is no t yet approved or cleared by the [place] FDA and  has been authorized for detection and/or diagnosis of SARS-CoV-2 by FDA under an Emergency Use Authorization (EUA). This EUA will remain  in effect (meaning this test can be used) for the duration of the COVID-19 declaration under Section 564(b)(1) of the Act, 21 U.S.C.section 360bbb-3(b)(1), unless the authorization is terminated  or revoked sooner.       Influenza A by PCR NEGATIVE  NEGATIVE Final   Influenza B by PCR NEGATIVE NEGATIVE Final    Comment: (NOTE) The Xpert Xpress SARS-CoV-2/FLU/RSV plus assay is intended as an aid in the diagnosis of influenza from Nasopharyngeal swab specimens and should not be used as a sole basis for treatment. Nasal washings and aspirates are unacceptable for Xpert Xpress SARS-CoV-2/FLU/RSV testing.  Fact Sheet for Patients: BloggerCourse.com  Fact Sheet for Healthcare Providers: SeriousBroker.it  This test is not yet approved or cleared by the [place] FDA and has been authorized for detection and/or diagnosis of SARS-CoV-2 by FDA under an Emergency Use Authorization (EUA). This EUA will remain in effect (meaning this test can be used) for the duration of the COVID-19 declaration under Section 564(b)(1) of the Act, 21 U.S.C. section 360bbb-3(b)(1), unless the authorization is terminated or revoked.  Performed at [facility] [place] [facility], [address], [place], [place] [address]          Radiology Studies: [facility] Chest [place] 1 View  Result Date: 11/09/2020 CLINICAL DATA:  Suspected sepsis EXAM: PORTABLE CHEST 1 VIEW COMPARISON:  None. FINDINGS: Mild cardiomegaly. Suspect heterogeneous airspace opacity at the left lung base with loss of diaphragmatic distinction. The visualized skeletal structures are unremarkable. IMPRESSION: 1. Suspect heterogeneous airspace opacity at the left lung base, concerning for infection. PA and lateral radiographs may be helpful to further evaluate. 2. Mild cardiomegaly. Electronically Signed   By: Lauralyn Primes M.D.   On: 11/09/2020 12:23        Scheduled Meds: . benztropine  0.5 mg Oral BID  . cholecalciferol  2,000 Units Oral Daily  . enoxaparin (LOVENOX) injection  0.5 mg/kg Subcutaneous Q24H  . loratadine  10 mg Oral Daily  . LORazepam  1 mg Oral Daily  . multivitamin with minerals  1 tablet Oral Daily  . pantoprazole  40 mg Oral  Daily  . pravastatin  40 mg Oral QHS  . risperiDONE  1 mg Oral BID  . risperidone  4 mg Oral BID  . vitamin E  400 Units Oral Daily   Continuous Infusions: . azithromycin Stopped (11/09/20 1836)  . ceFEPime (MAXIPIME) IV 2 g (11/10/20 0118)  . valproate sodium Stopped (11/09/20 2202)     LOS: 1 day    Time spent: 28 minutes    Marrion Coy, MD [facility]   To contact the attending provider between 7A-7P or the covering provider during after hours 7P-7A, please log into the web site [email or web address] and access using universal [place] password for that web site. If you do not  have the password, please call the hospital operator.  11/10/2020, 12:36 PM

## 2020-11-11 DIAGNOSIS — J189 Pneumonia, unspecified organism: Secondary | ICD-10-CM | POA: Diagnosis not present

## 2020-11-11 LAB — CBC WITH DIFFERENTIAL/PLATELET
Abs Immature Granulocytes: 0.12 10*3/uL — ABNORMAL HIGH (ref 0.00–0.07)
Basophils Absolute: 0 10*3/uL (ref 0.0–0.1)
Basophils Relative: 0 %
Eosinophils Absolute: 0 10*3/uL (ref 0.0–0.5)
Eosinophils Relative: 0 %
HCT: 29.8 % — ABNORMAL LOW (ref 36.0–46.0)
Hemoglobin: 9.2 g/dL — ABNORMAL LOW (ref 12.0–15.0)
Immature Granulocytes: 1 %
Lymphocytes Relative: 15 %
Lymphs Abs: 1.9 10*3/uL (ref 0.7–4.0)
MCH: 28 pg (ref 26.0–34.0)
MCHC: 30.9 g/dL (ref 30.0–36.0)
MCV: 90.9 fL (ref 80.0–100.0)
Monocytes Absolute: 1.8 10*3/uL — ABNORMAL HIGH (ref 0.1–1.0)
Monocytes Relative: 15 %
Neutro Abs: 8.3 10*3/uL — ABNORMAL HIGH (ref 1.7–7.7)
Neutrophils Relative %: 69 %
Platelets: 162 10*3/uL (ref 150–400)
RBC: 3.28 MIL/uL — ABNORMAL LOW (ref 3.87–5.11)
RDW: 13.7 % (ref 11.5–15.5)
WBC: 12.1 10*3/uL — ABNORMAL HIGH (ref 4.0–10.5)
nRBC: 0 % (ref 0.0–0.2)

## 2020-11-11 LAB — BASIC METABOLIC PANEL
Anion gap: 6 (ref 5–15)
BUN: 16 mg/dL (ref 8–23)
CO2: 27 mmol/L (ref 22–32)
Calcium: 8.2 mg/dL — ABNORMAL LOW (ref 8.9–10.3)
Chloride: 105 mmol/L (ref 98–111)
Creatinine, Ser: 0.96 mg/dL (ref 0.44–1.00)
GFR, Estimated: >60 mL/min (ref >60)
Glucose, Bld: 99 mg/dL (ref 70–99)
Potassium: 3.6 mmol/L (ref 3.5–5.1)
Sodium: 138 mmol/L (ref 135–145)

## 2020-11-11 LAB — LEGIONELLA PNEUMOPHILA SEROGP 1 UR AG: L. pneumophila Serogp 1 Ur Ag: NEGATIVE

## 2020-11-11 LAB — URINE CULTURE

## 2020-11-11 LAB — MAGNESIUM: Magnesium: 2.1 mg/dL (ref 1.7–2.4)

## 2020-11-11 MED ORDER — DIVALPROEX SODIUM 500 MG PO DR TAB
1000.0000 mg | DELAYED_RELEASE_TABLET | Freq: Every day | ORAL | Status: DC
  Administered 2020-11-11 – 2020-11-17 (×7): 1000 mg via ORAL
  Filled 2020-11-11 (×10): qty 2

## 2020-11-11 NOTE — Progress Notes (Signed)
Pt temp has been up and down; latest temp 100.8 F, HR 84. Oncall provider informed with no new orders. Will cont to monitor.

## 2020-11-11 NOTE — Progress Notes (Signed)
[facility] Progress Note  Patient: Debra Rich    CXK:[id number]  DOA: 11/09/2020     Date of Service: the patient was seen and examined on 11/11/2020  Chief Complaint  Patient presents with  . Altered Mental Status  . Fever   Brief hospital course: seizure-like activity(per Dr. Melrose Nakayama of neurology clinic note on 07/17/20), hypertension,bipolar disorderhyperlipidemia, GERD, anxiety, who presents with altered mental status and fever. Upon arriving the emergency room, patient met sepsis criteria with leukocytosis, tachycardia, tachypnea and mild hypoxemia.  Urine study was abnormal, chest x-ray showed left basilar infiltrates. Patient is started on with cefepime for UTI and pneumonia, she was also put down zithromax.   Assessment and Plan: Principal Problem:   CAP (community acquired pneumonia) Active Problems:   HTN (hypertension)   HLD (hyperlipidemia)   Seizure-like activity (HCC)   GERD (gastroesophageal reflux disease)   Sepsis ([place])   Acute metabolic encephalopathy   Acute respiratory failure with hypoxia (HCC)   UTI (urinary tract infection)  #  Sepsis.  Secondary to pneumonia and UTI. Left lower lobe pneumonia. UA positive, asymptomatic bacteriuria, patient denies any issues Urine culture multiple species present, suggestive recollection Blood culture NGTD, Covid negative Acute metabolic encephalopathy. Traumatic brain injury. Patient still looks tired but she is more alert, AAO x3 Continue cefepime 2 g IV every 12 hourly and azithromycin 40 mg IV daily 3/16 follow repeat blood cultures  #  Acute hypoxemic respiratory failure secondary to pneumonia. Continue oxygen.   #  Seizure disorder. Continues on home medicines.  Body mass index is 38.97 kg/m.        Diet: Heart healthy DVT Prophylaxis: Subcutaneous Lovenox   Advance goals of care discussion: Full code  Family Communication: family was NOT present at bedside, at the time of interview.   The pt provided permission to discuss medical plan with the family. Opportunity was given to ask question and all questions were answered satisfactorily.   Disposition:  Pt is from group home, admitted with respiratory failure, pneumonia, still has respiratory failure, which precludes a safe discharge. Discharge to group home, when pneumonia improved and respiratory failure resolves.  Subjective: No significant overnight issues, patient spiked fever, patient feels improvement in the shortness of breath, denies any worsening of breathing, denies any chest pain palpitations, no any abdominal pain, no nausea vomiting or diarrhea. Patient denied any dysuria  Physical Exam: General:  alert oriented to time, place, and person.  Appear in mild distress, affect appropriate Eyes: PERRLA ENT: Oral Mucosa Clear, moist  Neck: no JVD,  Cardiovascular: S1 and S2 Present, no Murmur,  Respiratory: good respiratory effort, Bilateral Air entry equal and Decreased, mild bilateral wheezing, mild crackles right basilar area Abdomen: Bowel Sound present, Soft and no tenderness,  Skin: no rashes Extremities: no Pedal edema, no calf tenderness Neurologic: without any new focal findings Gait not checked due to patient safety concerns  Vitals:   11/11/20 0205 11/11/20 0418 11/11/20 0831 11/11/20 1125  BP:  (!) 110/58 (!) 115/59 121/82  Pulse: 95 82 77 83  Resp:  $Remo'20 20 18  'UDTwz$ Temp: (!) 101 F (38.3 C) 100.3 F (37.9 C) 99.3 F (37.4 C) 98.7 F (37.1 C)  TempSrc: Oral Oral Oral Oral  SpO2:  93% 95% 93%  Weight:      Height:        Intake/Output Summary (Last 24 hours) at 11/11/2020 1517 Last data filed at 11/11/2020 1500 Gross per 24 hour  Intake 860 ml  Output 1500  ml  Net -640 ml   Filed Weights   11/09/20 1155  Weight: 99.8 kg    Data Reviewed: I have personally reviewed and interpreted daily labs, tele strips, imagings as discussed above. I reviewed all nursing notes, pharmacy notes,  vitals, pertinent old records I have discussed plan of care as described above with RN and patient/family.  CBC: Recent Labs  Lab 11/09/20 1200 11/10/20 0526 11/11/20 0532  WBC 19.0* 19.0* 12.1*  NEUTROABS 14.7*  --  8.3*  HGB 11.3* 10.3* 9.2*  HCT 34.9* 32.7* 29.8*  MCV 87.9 89.8 90.9  PLT 211 180 409   Basic Metabolic Panel: Recent Labs  Lab 11/09/20 1200 11/10/20 0526 11/11/20 0532  NA 135 136 138  K 3.7 3.8 3.6  CL 100 104 105  CO2 $Re'25 25 27  'cAC$ GLUCOSE 128* 105* 99  BUN $Re'15 15 16  'eFK$ CREATININE 1.18* 0.97 0.96  CALCIUM 8.8* 8.2* 8.2*  MG  --   --  2.1    Studies: No results found.  Scheduled Meds: . benztropine  0.5 mg Oral BID  . cholecalciferol  2,000 Units Oral Daily  . divalproex  1,000 mg Oral QHS  . enoxaparin (LOVENOX) injection  0.5 mg/kg Subcutaneous Q24H  . loratadine  10 mg Oral Daily  . LORazepam  1 mg Oral Daily  . multivitamin with minerals  1 tablet Oral Daily  . pantoprazole  40 mg Oral Daily  . pravastatin  40 mg Oral QHS  . risperiDONE  1 mg Oral BID  . risperidone  4 mg Oral BID  . vitamin E  400 Units Oral Daily   Continuous Infusions: . azithromycin 500 mg (11/10/20 1548)  . ceFEPime (MAXIPIME) IV 2 g (11/11/20 1440)   PRN Meds: acetaminophen, acetaminophen, albuterol, dextromethorphan-guaiFENesin, hydrALAZINE, LORazepam, ondansetron (ZOFRAN) IV  Time spent: 35 minutes  Author: Val Riles. MD [facility] 11/11/2020 3:17 PM  To reach On-call, see care teams to locate the attending and reach out to them via [email or web address]. If 7PM-7AM, please contact night-coverage If you still have difficulty reaching the attending provider, please page the [facility] (Director on Call) for [facility] on amion for assistance.

## 2020-11-11 NOTE — Progress Notes (Signed)
   11/10/20 1958  Assess: MEWS Score  Temp (!) 102.8 F (39.3 C)  BP (!) 143/91  Pulse Rate (!) 110  Resp 20  SpO2 (!) 89 %  O2 Device Nasal Cannula  O2 Flow Rate (L/min) 4 L/min  Assess: MEWS Score  MEWS Temp 2  MEWS Systolic 0  MEWS Pulse 1  MEWS RR 0  MEWS LOC 0  MEWS Score 3  MEWS Score Color Yellow  Assess: if the MEWS score is Yellow or Red  Were vital signs taken at a resting state? Yes  Focused Assessment No change from prior assessment  Early Detection of Sepsis Score *See Row Information* High  MEWS guidelines implemented *See Row Information* No, previously yellow, continue vital signs every 4 hours  Treat  MEWS Interventions Administered prn meds/treatments  Pain Scale 0-10  Pain Score 0  Notify: Charge Nurse/RN  Name of Charge Nurse/RN Notified Kaira RN  Date Charge Nurse/RN Notified 11/10/20  Time Charge Nurse/RN Notified 2000   Pt given tylenol 1000mg  PO PRN. Will rechecked her V/S. Will cont to monitor.

## 2020-11-12 DIAGNOSIS — J189 Pneumonia, unspecified organism: Secondary | ICD-10-CM | POA: Diagnosis not present

## 2020-11-12 LAB — CBC
HCT: 31.3 % — ABNORMAL LOW (ref 36.0–46.0)
Hemoglobin: 10 g/dL — ABNORMAL LOW (ref 12.0–15.0)
MCH: 28.6 pg (ref 26.0–34.0)
MCHC: 31.9 g/dL (ref 30.0–36.0)
MCV: 89.4 fL (ref 80.0–100.0)
Platelets: 166 10*3/uL (ref 150–400)
RBC: 3.5 MIL/uL — ABNORMAL LOW (ref 3.87–5.11)
RDW: 13.6 % (ref 11.5–15.5)
WBC: 9.4 10*3/uL (ref 4.0–10.5)
nRBC: 0 % (ref 0.0–0.2)

## 2020-11-12 LAB — BASIC METABOLIC PANEL
Anion gap: 6 (ref 5–15)
BUN: 12 mg/dL (ref 8–23)
CO2: 30 mmol/L (ref 22–32)
Calcium: 8.5 mg/dL — ABNORMAL LOW (ref 8.9–10.3)
Chloride: 103 mmol/L (ref 98–111)
Creatinine, Ser: 0.67 mg/dL (ref 0.44–1.00)
GFR, Estimated: >60 mL/min (ref >60)
Glucose, Bld: 118 mg/dL — ABNORMAL HIGH (ref 70–99)
Potassium: 3.7 mmol/L (ref 3.5–5.1)
Sodium: 139 mmol/L (ref 135–145)

## 2020-11-12 LAB — PHOSPHORUS: Phosphorus: 2.8 mg/dL (ref 2.5–4.6)

## 2020-11-12 LAB — MAGNESIUM: Magnesium: 2.2 mg/dL (ref 1.7–2.4)

## 2020-11-12 MED ORDER — IPRATROPIUM-ALBUTEROL 0.5-2.5 (3) MG/3ML IN SOLN
3.0000 mL | Freq: Four times a day (QID) | RESPIRATORY_TRACT | Status: DC
  Administered 2020-11-12 – 2020-11-13 (×3): 3 mL via RESPIRATORY_TRACT
  Filled 2020-11-12 (×3): qty 3

## 2020-11-12 MED ORDER — FLUTICASONE FUROATE-VILANTEROL 100-25 MCG/INH IN AEPB
1.0000 | INHALATION_SPRAY | Freq: Every day | RESPIRATORY_TRACT | Status: DC
  Administered 2020-11-12 – 2020-11-17 (×6): 1 via RESPIRATORY_TRACT
  Filled 2020-11-12: qty 28

## 2020-11-12 NOTE — Progress Notes (Signed)
[facility] Progress Note  Patient: Debra Rich    EHO:[id number]  DOA: 11/09/2020     Date of Service: the patient was seen and examined on 11/12/2020  Chief Complaint  Patient presents with  . Altered Mental Status  . Fever   Brief hospital course: seizure-like activity(per Dr. Malvin Johns of neurology clinic note on 07/17/20), hypertension,bipolar disorderhyperlipidemia, GERD, anxiety, who presents with altered mental status and fever. Upon arriving the emergency room, patient met sepsis criteria with leukocytosis, tachycardia, tachypnea and mild hypoxemia.  Urine study was abnormal, chest x-ray showed left basilar infiltrates. Patient is started on with cefepime for UTI and pneumonia, she was also put down zithromax.   Assessment and Plan: Principal Problem:   CAP (community acquired pneumonia) Active Problems:   HTN (hypertension)   HLD (hyperlipidemia)   Seizure-like activity (HCC)   GERD (gastroesophageal reflux disease)   Sepsis (HCC)   Acute metabolic encephalopathy   Acute respiratory failure with hypoxia (HCC)   UTI (urinary tract infection)  #  Sepsis.  Secondary to pneumonia and UTI. Left lower lobe pneumonia. UA positive, asymptomatic bacteriuria, patient denies any issues Urine culture multiple species present, suggestive recollection Blood culture NGTD, Covid negative Acute metabolic encephalopathy. Traumatic brain injury. Patient still looks tired but she is more alert, AAO x3 Continue cefepime 2 g IV every 12 hourly and azithromycin 500 mg IV daily 3/16 repeat blood cultures NGTD  #  Acute hypoxemic respiratory failure secondary to pneumonia. Possible COPD as well Continue supplemental O2 inhalation and gradually wean off Started Breo Ellipta and DuoNeb as needed    #  Seizure disorder. Continues on home medicines. Continue seizure precaution  Body mass index is 38.97 kg/m.    Ambulate patient, follow PT and OT eval   Diet: Heart  healthy DVT Prophylaxis: Subcutaneous Lovenox   Advance goals of care discussion: Full code  Family Communication: family was NOT present at bedside, at the time of interview.  The pt provided permission to discuss medical plan with the family. Opportunity was given to ask question and all questions were answered satisfactorily.   Disposition:  Pt is from group home, admitted with respiratory failure, pneumonia, still has respiratory failure, which precludes a safe discharge. Discharge to group home, when pneumonia improved and respiratory failure resolves.  Subjective: No significant overnight issues, patient feels improvement in the shortness of breath but is still requiring supplemental O2 inhalation.  Patient denied any other active issues.   Physical Exam: General:  alert oriented to time, place, and person.  Appear in mild distress, affect appropriate Eyes: PERRLA ENT: Oral Mucosa Clear, moist  Neck: no JVD,  Cardiovascular: S1 and S2 Present, no Murmur,  Respiratory: good respiratory effort, Bilateral Air entry equal and Decreased, mild bilateral wheezing, mild crackles right basilar area Abdomen: Bowel Sound present, Soft and no tenderness,  Skin: no rashes Extremities: no Pedal edema, no calf tenderness Neurologic: without any new focal findings Gait not checked due to patient safety concerns  Vitals:   11/11/20 2039 11/12/20 0510 11/12/20 0802 11/12/20 1102  BP: 135/63 (!) 151/69 (!) 117/53 133/61  Pulse: 80 89 84 81  Resp:   20 17  Temp: 98.5 F (36.9 C) 98.5 F (36.9 C) 99 F (37.2 C) 98.6 F (37 C)  TempSrc: Oral Oral Oral Oral  SpO2: 92% 91% 95% 93%  Weight:      Height:        Intake/Output Summary (Last 24 hours) at 11/12/2020 1435 Last data filed  at 11/12/2020 0941 Gross per 24 hour  Intake 1164.29 ml  Output 1850 ml  Net -685.71 ml   Filed Weights   11/09/20 1155  Weight: 99.8 kg    Data Reviewed: I have personally reviewed and interpreted  daily labs, tele strips, imagings as discussed above. I reviewed all nursing notes, pharmacy notes, vitals, pertinent old records I have discussed plan of care as described above with RN and patient/family.  CBC: Recent Labs  Lab 11/09/20 1200 11/10/20 0526 11/11/20 0532 11/12/20 0510  WBC 19.0* 19.0* 12.1* 9.4  NEUTROABS 14.7*  --  8.3*  --   HGB 11.3* 10.3* 9.2* 10.0*  HCT 34.9* 32.7* 29.8* 31.3*  MCV 87.9 89.8 90.9 89.4  PLT 211 180 162 301   Basic Metabolic Panel: Recent Labs  Lab 11/09/20 1200 11/10/20 0526 11/11/20 0532 11/12/20 0510  NA 135 136 138 139  K 3.7 3.8 3.6 3.7  CL 100 104 105 103  CO2 $Re'25 25 27 30  'DkB$ GLUCOSE 128* 105* 99 118*  BUN $Re'15 15 16 12  'YBC$ CREATININE 1.18* 0.97 0.96 0.67  CALCIUM 8.8* 8.2* 8.2* 8.5*  MG  --   --  2.1 2.2  PHOS  --   --   --  2.8    Studies: No results found.  Scheduled Meds: . benztropine  0.5 mg Oral BID  . cholecalciferol  2,000 Units Oral Daily  . divalproex  1,000 mg Oral QHS  . enoxaparin (LOVENOX) injection  0.5 mg/kg Subcutaneous Q24H  . fluticasone furoate-vilanterol  1 puff Inhalation Daily  . ipratropium-albuterol  3 mL Nebulization Q6H  . loratadine  10 mg Oral Daily  . LORazepam  1 mg Oral Daily  . multivitamin with minerals  1 tablet Oral Daily  . pantoprazole  40 mg Oral Daily  . pravastatin  40 mg Oral QHS  . risperiDONE  1 mg Oral BID  . risperidone  4 mg Oral BID  . vitamin E  400 Units Oral Daily   Continuous Infusions: . azithromycin Stopped (11/11/20 1900)  . ceFEPime (MAXIPIME) IV 2 g (11/12/20 1316)   PRN Meds: acetaminophen, acetaminophen, dextromethorphan-guaiFENesin, hydrALAZINE, LORazepam, ondansetron (ZOFRAN) IV  Time spent: 35 minutes  Author: Val Riles. MD [facility] 11/12/2020 2:35 PM  To reach On-call, see care teams to locate the attending and reach out to them via [email or web address]. If 7PM-7AM, please contact night-coverage If you still have difficulty reaching the attending  provider, please page the [facility] (Director on Call) for [facility] on amion for assistance.

## 2020-11-12 NOTE — Plan of Care (Signed)
Patient alert and oriented to self. Denies pain or discomfort. Needs 1 person assist with adls. Call bell within reach, personal items in reach. No fevers this shift. IV antibiotics infused and tolerated well. Will continue plan of care.  Problem: Urinary Elimination: Goal: Signs and symptoms of infection will decrease Outcome: Progressing   Problem: Education: Goal: Knowledge of General Education information will improve Description: Including pain rating scale, medication(s)/side effects and non-pharmacologic comfort measures Outcome: Progressing   Problem: Health Behavior/Discharge Planning: Goal: Ability to manage health-related needs will improve Outcome: Progressing   Problem: Clinical Measurements: Goal: Ability to maintain clinical measurements within normal limits will improve Outcome: Progressing Goal: Will remain free from infection Outcome: Progressing Goal: Diagnostic test results will improve Outcome: Progressing Goal: Respiratory complications will improve Outcome: Progressing Goal: Cardiovascular complication will be avoided Outcome: Progressing   Problem: Activity: Goal: Risk for activity intolerance will decrease Outcome: Progressing   Problem: Nutrition: Goal: Adequate nutrition will be maintained Outcome: Progressing   Problem: Coping: Goal: Level of anxiety will decrease Outcome: Progressing   Problem: Elimination: Goal: Will not experience complications related to bowel motility Outcome: Progressing Goal: Will not experience complications related to urinary retention Outcome: Progressing   Problem: Pain Managment: Goal: General experience of comfort will improve Outcome: Progressing   Problem: Safety: Goal: Ability to remain free from injury will improve Outcome: Progressing   Problem: Skin Integrity: Goal: Risk for impaired skin integrity will decrease Outcome: Progressing

## 2020-11-12 NOTE — Care Management Important Message (Signed)
Important Message  Patient Details  Name: Debra Rich MRN: [id number] Date of Birth: 29-Aug-1956   Medicare Important Message Given:  Yes     [name] 11/12/2020, 1:58 PM

## 2020-11-13 DIAGNOSIS — J189 Pneumonia, unspecified organism: Secondary | ICD-10-CM | POA: Diagnosis not present

## 2020-11-13 LAB — PHOSPHORUS: Phosphorus: 3.6 mg/dL (ref 2.5–4.6)

## 2020-11-13 LAB — BASIC METABOLIC PANEL
Anion gap: 6 (ref 5–15)
BUN: 11 mg/dL (ref 8–23)
CO2: 31 mmol/L (ref 22–32)
Calcium: 8.8 mg/dL — ABNORMAL LOW (ref 8.9–10.3)
Chloride: 104 mmol/L (ref 98–111)
Creatinine, Ser: 0.66 mg/dL (ref 0.44–1.00)
GFR, Estimated: >60 mL/min (ref >60)
Glucose, Bld: 108 mg/dL — ABNORMAL HIGH (ref 70–99)
Potassium: 3.9 mmol/L (ref 3.5–5.1)
Sodium: 141 mmol/L (ref 135–145)

## 2020-11-13 LAB — CBC
HCT: 29.8 % — ABNORMAL LOW (ref 36.0–46.0)
Hemoglobin: 9.3 g/dL — ABNORMAL LOW (ref 12.0–15.0)
MCH: 28.2 pg (ref 26.0–34.0)
MCHC: 31.2 g/dL (ref 30.0–36.0)
MCV: 90.3 fL (ref 80.0–100.0)
Platelets: 195 10*3/uL (ref 150–400)
RBC: 3.3 MIL/uL — ABNORMAL LOW (ref 3.87–5.11)
RDW: 13.7 % (ref 11.5–15.5)
WBC: 7.7 10*3/uL (ref 4.0–10.5)
nRBC: 0 % (ref 0.0–0.2)

## 2020-11-13 LAB — MAGNESIUM: Magnesium: 2.2 mg/dL (ref 1.7–2.4)

## 2020-11-13 MED ORDER — CEFUROXIME AXETIL 500 MG PO TABS
500.0000 mg | ORAL_TABLET | Freq: Two times a day (BID) | ORAL | Status: AC
  Administered 2020-11-13 – 2020-11-15 (×4): 500 mg via ORAL
  Filled 2020-11-13 (×7): qty 1

## 2020-11-13 MED ORDER — IPRATROPIUM-ALBUTEROL 0.5-2.5 (3) MG/3ML IN SOLN
3.0000 mL | Freq: Two times a day (BID) | RESPIRATORY_TRACT | Status: DC
  Administered 2020-11-13 – 2020-11-14 (×2): 3 mL via RESPIRATORY_TRACT
  Filled 2020-11-13 (×2): qty 3

## 2020-11-13 NOTE — Evaluation (Signed)
Physical Therapy Evaluation Patient Details Name: Debra Rich MRN: [id number] DOB: 1956/01/06 Today's Date: 11/13/2020   History of Present Illness  Pt is a 65 y/o F admitted from group home on 11/09/20 with c/c of AMS & fever. Pt being treated for sepsis 2/2 CAP & UTI. PMH: TBI, seizure like activity, HTN, bipolar disorder, HLD, GERD, anxiety  Clinical Impression  Pt seen for PT evaluation with pt being inconsistent historian re: PLOF. Pt requires min assist at most for mobility but requires use of RW for safe ambulation with MAX cuing re: proper use of AD. Pt is able to ambulate short distances in room only. Pt on 2L/min via nasal cannula with cannula removed & pt able to maintain SpO2 87% or > on room air during bed mobility & transfers, but drops to 82% after ambulating 10 ft so pt placed back on 2L/min for remainder of session & further ambulation attempts. Pt then 89% or > on 2L/min. PT educates pt on pursed lip breathing throughout & use of incentive spirometer, but pt would benefit from ongoing edu re: use of device. Pt would benefit from STR upon d/c to maximize independence with functional mobility & reduce fall risk prior to return home. Will continue to follow pt acutely to address deficits noted.      Follow Up Recommendations SNF;Supervision/Assistance - 24 hour    Equipment Recommendations  Rolling walker with 5" wheels;3in1 (PT)    Recommendations for Other Services OT consult     Precautions / Restrictions Precautions Precautions: Fall Restrictions Weight Bearing Restrictions: No      Mobility  Bed Mobility Overal bed mobility: Needs Assistance Bed Mobility: Supine to Sit     Supine to sit: Min assist;HOB elevated     General bed mobility comments: cuing to use bed rails, assistance to move RLE to EOB, encouragement to initiate/complete movement, pt elects to hold to PT's hand to pull herself to upright sitting EOB    Transfers Overall transfer level: Needs  assistance Equipment used: Rolling walker (2 wheeled) Transfers: Sit to/from [employer] Sit to Stand: Supervision;Min guard Stand pivot transfers: Min assist       General transfer comment: cuing for hand placement  Ambulation/Gait Ambulation/Gait assistance: Min assist Gait Distance (Feet):  (10 ft + 20 ft + 20 ft + 20 ft) Assistive device: Rolling walker (2 wheeled) Gait Pattern/deviations: Decreased step length - left;Decreased stride length;Decreased step length - right Gait velocity: decreased   General Gait Details: Pt leaning forwards on RW, cuing to stand upright within base of AD but still pushes RW too far out in front of her.  Stairs            Wheelchair Mobility    Modified Rankin (Stroke Patients Only)       Balance Overall balance assessment: Needs assistance   Sitting balance-Leahy Scale: Good Sitting balance - Comments: supervision sitting EOB     Standing balance-Leahy Scale: Fair                               Pertinent Vitals/Pain Pain Assessment: Faces Faces Pain Scale: Hurts whole lot Pain Location: R groin/thigh Pain Descriptors / Indicators: Crying (Pt reporting R thigh/groin pain & crying with supine>sit but then once sitting EOB no other c/o pain during session.) Pain Intervention(s): Monitored during session;Repositioned (nurse notified)    Home Living Family/patient expects to be discharged to:: Group home  Additional Comments: Pt reports 1 level home with ramped entrance (google maps shows 7 STE with wideset B rails)    Prior Function Level of Independence: Independent with assistive device(s)         Comments: Pt is inconsistent historian. Pt reports having a rollator that she uses, then states she doesn't use it for mobility.     Hand Dominance        Extremity/Trunk Assessment   Upper Extremity Assessment Upper Extremity Assessment: Generalized weakness    Lower  Extremity Assessment Lower Extremity Assessment: Generalized weakness       Communication      Cognition Arousal/Alertness: Awake/alert Behavior During Therapy: Anxious Overall Cognitive Status: No family/caregiver present to determine baseline cognitive functioning Area of Impairment: Orientation;Attention;Memory;Safety/judgement;Awareness;Following commands;Problem solving                 Orientation Level: Person;Place;Time (oriented to month & year only)   Memory: Decreased short-term memory Following Commands: Follows one step commands inconsistently;Follows one step commands with increased time Safety/Judgement: Decreased awareness of safety;Decreased awareness of deficits Awareness: Intellectual;Emergent;Anticipatory Problem Solving: Slow processing;Decreased initiation;Difficulty sequencing;Requires verbal cues;Requires tactile cues        General Comments General comments (skin integrity, edema, etc.): Pt ambulates to/from bathroom & has continent void & BM but requires dependent assist for peri hygiene.    Exercises     Assessment/Plan    PT Assessment Patient needs continued PT services  PT Problem List Decreased strength;Decreased mobility;Decreased safety awareness;Obesity;Cardiopulmonary status limiting activity;Decreased activity tolerance;Decreased balance;Decreased cognition;Decreased knowledge of use of DME;Pain       PT Treatment Interventions DME instruction;Therapeutic activities;Cognitive remediation;Modalities;Patient/family education;Therapeutic exercise;Gait training;Stair training;Balance training;Functional mobility training;Neuromuscular re-education;Manual techniques    PT Goals (Current goals can be found in the Care Plan section)  Acute Rehab PT Goals Patient Stated Goal: none stated PT Goal Formulation: With patient Time For Goal Achievement: 11/27/20 Potential to Achieve Goals: Good    Frequency Min 2X/week   Barriers to discharge  Decreased caregiver support      Co-evaluation               AM-PAC PT "6 Clicks" Mobility  Outcome Measure Help needed turning from your back to your side while in a flat bed without using bedrails?: A Little Help needed moving from lying on your back to sitting on the side of a flat bed without using bedrails?: A Lot Help needed moving to and from a bed to a chair (including a wheelchair)?: A Little Help needed standing up from a chair using your arms (e.g., wheelchair or bedside chair)?: A Little Help needed to walk in hospital room?: A Little Help needed climbing 3-5 steps with a railing? : A Lot 6 Click Score: 16    End of Session Equipment Utilized During Treatment: Oxygen;Gait belt Activity Tolerance: Patient tolerated treatment well Patient left: in chair;with call bell/phone within reach;with chair alarm set Nurse Communication: Mobility status (pain) PT Visit Diagnosis: Unsteadiness on feet (R26.81);Difficulty in walking, not elsewhere classified (R26.2);Muscle weakness (generalized) (M62.81)    Time: 1610-9604 PT Time Calculation (min) (ACUTE ONLY): 46 min   Charges:   PT Evaluation $PT Eval Low Complexity: 1 Low PT Treatments $Therapeutic Activity: 23-37 mins        Aleda Grana, PT, DPT 11/13/20, 10:38 AM   Sandi Mariscal 11/13/2020, 10:36 AM

## 2020-11-13 NOTE — Progress Notes (Signed)
[facility] Progress Note  Patient: Debra Rich    TFT:[id number]  DOA: 11/09/2020     Date of Service: the patient was seen and examined on 11/13/2020  Chief Complaint  Patient presents with  . Altered Mental Status  . Fever   Brief hospital course: seizure-like activity(per Dr. Melrose Nakayama of neurology clinic note on 07/17/20), hypertension,bipolar disorderhyperlipidemia, GERD, anxiety, who presents with altered mental status and fever. Upon arriving the emergency room, patient met sepsis criteria with leukocytosis, tachycardia, tachypnea and mild hypoxemia.  Urine study was abnormal, chest x-ray showed left basilar infiltrates. Patient is started on with cefepime for UTI and pneumonia, she was also put down zithromax.   Assessment and Plan: Principal Problem:   CAP (community acquired pneumonia) Active Problems:   HTN (hypertension)   HLD (hyperlipidemia)   Seizure-like activity (HCC)   GERD (gastroesophageal reflux disease)   Sepsis ([place])   Acute metabolic encephalopathy   Acute respiratory failure with hypoxia (HCC)   UTI (urinary tract infection)  #  Sepsis.  Secondary to pneumonia and UTI. Left lower lobe pneumonia. UA positive, asymptomatic bacteriuria, patient denies any issues Urine culture multiple species present, suggestive recollection Blood culture NGTD, Covid negative Acute metabolic encephalopathy. Traumatic brain injury. Patient still looks tired but she is more alert, AAO x3 S/p cefepime 2 g IV every 12 hourly and azithromycin 500 mg IV daily x 5 days 3/18 500 twice daily, continue for few days until off oxygen 3/16 repeat blood cultures NGTD Continue incentive spirometry   #  Acute hypoxemic respiratory failure secondary to pneumonia. Possible COPD as well Continue supplemental O2 inhalation and gradually wean off Started Breo Ellipta and DuoNeb as needed    #  Seizure disorder. Continues on home medicines. Continue seizure  precaution  Body mass index is 38.97 kg/m.    Ambulate patient, follow PT and OT eval   Diet: Heart healthy DVT Prophylaxis: Subcutaneous Lovenox   Advance goals of care discussion: Full code  Family Communication: family was NOT present at bedside, at the time of interview.  The pt provided permission to discuss medical plan with the family. Opportunity was given to ask question and all questions were answered satisfactorily.   Disposition:  Pt is from group home, admitted with respiratory failure, pneumonia, still has respiratory failure, which precludes a safe discharge. Discharge to group home, when pneumonia improved and respiratory failure resolves. May require 1 to 2 days more to wean off oxygen   Subjective: No significant overnight issues, patient feels improvement in the shortness of breath but is still requiring supplemental O2 inhalation.  Patient denied any other active issues.   Physical Exam: General:  alert oriented to time, place, and person.  Appear in mild distress, affect appropriate Eyes: PERRLA ENT: Oral Mucosa Clear, moist  Neck: no JVD,  Cardiovascular: S1 and S2 Present, no Murmur,  Respiratory: good respiratory effort, Bilateral Air entry equal and Decreased, mild bilateral wheezing, mild crackles right basilar area Abdomen: Bowel Sound present, Soft and no tenderness,  Skin: no rashes Extremities: no Pedal edema, no calf tenderness Neurologic: without any new focal findings Gait not checked due to patient safety concerns  Vitals:   11/12/20 2342 11/13/20 0415 11/13/20 0757 11/13/20 1243  BP: (!) 149/74 133/82  137/68  Pulse: 83 79 80 74  Resp: $Remo'20 20 18 16  'bJHGM$ Temp: 98.7 F (37.1 C) 98 F (36.7 C)  98.1 F (36.7 C)  TempSrc: Oral Oral  Oral  SpO2: 93% 94% 94% 93%  Weight:      Height:        Intake/Output Summary (Last 24 hours) at 11/13/2020 1641 Last data filed at 11/13/2020 1406 Gross per 24 hour  Intake 851.1 ml  Output 1300 ml  Net  -448.9 ml   Filed Weights   11/09/20 1155  Weight: 99.8 kg    Data Reviewed: I have personally reviewed and interpreted daily labs, tele strips, imagings as discussed above. I reviewed all nursing notes, pharmacy notes, vitals, pertinent old records I have discussed plan of care as described above with RN and patient/family.  CBC: Recent Labs  Lab 11/09/20 1200 11/10/20 0526 11/11/20 0532 11/12/20 0510 11/13/20 0517  WBC 19.0* 19.0* 12.1* 9.4 7.7  NEUTROABS 14.7*  --  8.3*  --   --   HGB 11.3* 10.3* 9.2* 10.0* 9.3*  HCT 34.9* 32.7* 29.8* 31.3* 29.8*  MCV 87.9 89.8 90.9 89.4 90.3  PLT 211 180 162 166 790   Basic Metabolic Panel: Recent Labs  Lab 11/09/20 1200 11/10/20 0526 11/11/20 0532 11/12/20 0510 11/13/20 0517  NA 135 136 138 139 141  K 3.7 3.8 3.6 3.7 3.9  CL 100 104 105 103 104  CO2 $Re'25 25 27 30 31  'FPI$ GLUCOSE 128* 105* 99 118* 108*  BUN $Re'15 15 16 12 11  'vTZ$ CREATININE 1.18* 0.97 0.96 0.67 0.66  CALCIUM 8.8* 8.2* 8.2* 8.5* 8.8*  MG  --   --  2.1 2.2 2.2  PHOS  --   --   --  2.8 3.6    Studies: No results found.  Scheduled Meds: . benztropine  0.5 mg Oral BID  . cefUROXime  500 mg Oral BID WC  . cholecalciferol  2,000 Units Oral Daily  . divalproex  1,000 mg Oral QHS  . enoxaparin (LOVENOX) injection  0.5 mg/kg Subcutaneous Q24H  . fluticasone furoate-vilanterol  1 puff Inhalation Daily  . ipratropium-albuterol  3 mL Nebulization BID  . loratadine  10 mg Oral Daily  . LORazepam  1 mg Oral Daily  . multivitamin with minerals  1 tablet Oral Daily  . pantoprazole  40 mg Oral Daily  . pravastatin  40 mg Oral QHS  . risperiDONE  1 mg Oral BID  . risperidone  4 mg Oral BID  . vitamin E  400 Units Oral Daily   Continuous Infusions:  PRN Meds: acetaminophen, acetaminophen, dextromethorphan-guaiFENesin, hydrALAZINE, LORazepam, ondansetron (ZOFRAN) IV  Time spent: 35 minutes  Author: Val Riles. MD [facility] 11/13/2020 4:41 PM  To reach On-call,  see care teams to locate the attending and reach out to them via [email or web address]. If 7PM-7AM, please contact night-coverage If you still have difficulty reaching the attending provider, please page the [facility] (Director on Call) for [facility] on amion for assistance.

## 2020-11-13 NOTE — Plan of Care (Signed)
Alert and oriented x 4. Denies pain or discomfort at a this time. Continues on IV abt, tolerated well. Continues on supplemental oxygen use. Needs 1 person assist with adls.  Problem: Urinary Elimination: Goal: Signs and symptoms of infection will decrease Outcome: Progressing   Problem: Education: Goal: Knowledge of General Education information will improve Description: Including pain rating scale, medication(s)/side effects and non-pharmacologic comfort measures Outcome: Progressing   Problem: Health Behavior/Discharge Planning: Goal: Ability to manage health-related needs will improve Outcome: Progressing   Problem: Clinical Measurements: Goal: Ability to maintain clinical measurements within normal limits will improve Outcome: Progressing Goal: Will remain free from infection Outcome: Progressing Goal: Diagnostic test results will improve Outcome: Progressing Goal: Respiratory complications will improve Outcome: Progressing Goal: Cardiovascular complication will be avoided Outcome: Progressing   Problem: Activity: Goal: Risk for activity intolerance will decrease Outcome: Progressing   Problem: Nutrition: Goal: Adequate nutrition will be maintained Outcome: Progressing   Problem: Coping: Goal: Level of anxiety will decrease Outcome: Progressing   Problem: Elimination: Goal: Will not experience complications related to bowel motility Outcome: Progressing Goal: Will not experience complications related to urinary retention Outcome: Progressing   Problem: Pain Managment: Goal: General experience of comfort will improve Outcome: Progressing   Problem: Safety: Goal: Ability to remain free from injury will improve Outcome: Progressing   Problem: Skin Integrity: Goal: Risk for impaired skin integrity will decrease Outcome: Progressing

## 2020-11-13 NOTE — Evaluation (Signed)
Occupational Therapy Evaluation Patient Details Name: Debra Rich MRN: [id number] DOB: 07-May-1956 Today's Date: 11/13/2020    History of Present Illness Pt is a 65 y/o F admitted from group home on 11/09/20 with c/c of AMS & fever. Pt being treated for sepsis 2/2 CAP & UTI. PMH: TBI, seizure like activity, HTN, bipolar disorder, HLD, GERD, anxiety   Clinical Impression   Pt seen for OT evaluation this date in setting of acute hospitalization d/t sepsis. Pt is poor historian, but reports she used to be INDEP, but unclear on timeline, how long she's needed assistance, what she needs assistance for, or who helps her. Chart review indicates pt is from group home. Pt requires MOD A for sup to sit transition and declines to attempt transfer to standing with OT citing pain in R groin (PT note indicates RN already notified of pain). Pt requires MIN A for seated or bed level UB ADLs and cues for sequencing and requires MAX A for LB ADLs 2/2 pain. Will continue to follow acutely. Anticipate pt will require STR in SNF setting to safely return to group home in setting of decreased fxl mobility and fxl activity tolerance.    Follow Up Recommendations  SNF    Equipment Recommendations  Other (comment) (defer)    Recommendations for Other Services       Precautions / Restrictions Precautions Precautions: Fall Restrictions Weight Bearing Restrictions: No      Mobility Bed Mobility Overal bed mobility: Needs Assistance Bed Mobility: Supine to Sit;Sit to Supine     Supine to sit: Min assist;HOB elevated Sit to supine: Mod assist   General bed mobility comments: increased time, cues for hand placement, increased assist for back to bed to assist with LEs as pt c/o pain    Transfers                 General transfer comment: pt declines to attempt transfers with OT    Balance Overall balance assessment: Needs assistance   Sitting balance-Leahy Scale: Good Sitting balance - Comments:  supervision sitting EOB                                   ADL either performed or assessed with clinical judgement   ADL Overall ADL's : Needs assistance/impaired                                       General ADL Comments: requires MIN A for bed level/seated UB ADLs, MAX to TOTAL A for LB ADLs d/t c/o R groin pain     Vision Patient Visual Report: No change from baseline       Perception     Praxis      Pertinent Vitals/Pain Pain Assessment: Faces Faces Pain Scale: Hurts whole lot Pain Location: R groin/thigh Pain Descriptors / Indicators: Cramping;Guarding Pain Intervention(s): Monitored during session;Repositioned     Hand Dominance     Extremity/Trunk Assessment Upper Extremity Assessment Upper Extremity Assessment: Generalized weakness   Lower Extremity Assessment Lower Extremity Assessment: Generalized weakness       Communication     Cognition Arousal/Alertness: Awake/alert Behavior During Therapy: Anxious;Flat affect Overall Cognitive Status: No family/caregiver present to determine baseline cognitive functioning Area of Impairment: Orientation;Attention;Memory;Safety/judgement;Awareness;Following commands;Problem solving  Orientation Level: Disoriented to;Place;Time;Situation Current Attention Level: Sustained Memory: Decreased short-term memory Following Commands: Follows one step commands inconsistently;Follows one step commands with increased time Safety/Judgement: Decreased awareness of safety;Decreased awareness of deficits Awareness: Intellectual;Emergent;Anticipatory Problem Solving: Slow processing;Decreased initiation;Difficulty sequencing;Requires verbal cues;Requires tactile cues General Comments: Pt able to follow simple one step commands with increased time   General Comments       Exercises Other Exercises Other Exercises: OT educates re: role and importance of OOB activity. Pt with  MIN/MOD reception   Shoulder Instructions      Home Living Family/patient expects to be discharged to:: Group home                                 Additional Comments: Pt reports 1 level home with ramped entrance (google maps shows 7 STE with wideset B rails)      Prior Functioning/Environment Level of Independence: Independent with assistive device(s)        Comments: Pt is inconsistent historian. Pt reports having a rollator that she uses, then states she doesn't use it for mobility.        OT Problem List: Decreased strength;Decreased range of motion;Decreased activity tolerance;Impaired balance (sitting and/or standing);Decreased safety awareness;Decreased knowledge of use of DME or AE;Obesity;Pain      OT Treatment/Interventions: Self-care/ADL training;DME and/or AE instruction;Therapeutic activities;Balance training;Therapeutic exercise;Energy conservation;Patient/family education    OT Goals(Current goals can be found in the care plan section) Acute Rehab OT Goals Patient Stated Goal: none stated OT Goal Formulation: With patient Time For Goal Achievement: 11/27/20 Potential to Achieve Goals: Fair ADL Goals Pt Will Perform Grooming: with min assist;sitting (to complete 2-3 g/h tasks to increase sitting fxl activity tolerance) Pt Will Transfer to Toilet: with mod assist;stand pivot transfer;bedside commode Pt Will Perform Toileting - Clothing Manipulation and hygiene: with mod assist;sitting/lateral leans Pt/caregiver will Perform Home Exercise Program: Increased strength;Both right and left upper extremity;With minimal assist Additional ADL Goal #1: Pt will increased OOB tolerance to 10 mins sitting vs 1 min standing to better engage in functional ADLs.  OT Frequency: Min 1X/week   Barriers to D/C:            Co-evaluation              AM-PAC OT "6 Clicks" Daily Activity     Outcome Measure Help from another person eating meals?: A  Little Help from another person taking care of personal grooming?: A Little Help from another person toileting, which includes using toliet, bedpan, or urinal?: A Lot Help from another person bathing (including washing, rinsing, drying)?: A Lot Help from another person to put on and taking off regular upper body clothing?: A Lot Help from another person to put on and taking off regular lower body clothing?: A Lot 6 Click Score: 14   End of Session    Activity Tolerance: Patient limited by pain Patient left: in bed;with call bell/phone within reach;with bed alarm set  OT Visit Diagnosis: Unsteadiness on feet (R26.81);Muscle weakness (generalized) (M62.81);Other symptoms and signs involving cognitive function                Time: [id number] OT Time Calculation (min): 15 min Charges:  OT General Charges $OT Visit: 1 Visit OT Evaluation $OT Eval Moderate Complexity: 1 Mod OT Treatments $Self Care/Home Management : 8-22 mins  Rejeana Brock, MS, OTR/L ascom [phone] 11/13/20, 9:00 PM

## 2020-11-13 NOTE — TOC Initial Note (Addendum)
Transition of Care [facility]) - Initial/Assessment Note    Patient Details  Name: Debra Rich MRN: [id number] Date of Birth: December 15, 1955  Transition of Care [facility]) CM/SW Contact:    Maree Krabbe, LCSW Phone Number: 11/13/2020, 3:09 PM  Clinical Narrative:  CSW spoke with pt and explained PT's recommendations. Pt is refusing SNF stating she is going back to her group home--[facility]. Pt is agreeable to [facility]. Referral given to [facility]. DME referral given to [facility]. CSW has attempted to reach Tora Perches [phone]) several times--unable to leave voicemail.                CSW spoke with Genevie Cheshire at [facility] and he states pt can not return there unless she can transition and walk with no assistance.  Expected Discharge Plan: Group Home Barriers to Discharge: Continued Medical Work up   Patient Goals and CMS Choice Patient states their goals for this hospitalization and ongoing recovery are:: to go back to group home   Choice offered to / list presented to : Patient  Expected Discharge Plan and Services Expected Discharge Plan: Group Home In-house Referral: NA   Post Acute Care Choice: Home Health Living arrangements for the past 2 months: Group Home                 DME Arranged: Cherre Huger DME Agency: [facility] Date DME Agency Contacted: 11/13/20 Time DME Agency Contacted: [phone] Representative spoke with at DME Agency: zach HH Arranged: PT,OT HH Agency: [facility] Date HH Agency Contacted: 11/13/20 Time HH Agency Contacted: [phone] Representative spoke with at [facility] Agency: Barbara Cower  Prior Living Arrangements/Services Living arrangements for the past 2 months: Group Home Lives with:: Self Patient language and need for interpreter reviewed:: Yes Do you feel safe going back to the place where you live?: Yes      Need for Family Participation in Patient Care: Yes (Comment) Care giver support system in place?: Yes (comment)   Criminal Activity/Legal  Involvement Pertinent to Current Situation/Hospitalization: No - Comment as needed  Activities of Daily Living Home Assistive Devices/Equipment: Walker (specify type),Shower chair with back ADL Screening (condition at time of admission) Patient's cognitive ability adequate to safely complete daily activities?: Yes Is the patient deaf or have difficulty hearing?: No Does the patient have difficulty seeing, even when wearing glasses/contacts?: No Does the patient have difficulty concentrating, remembering, or making decisions?: Yes Patient able to express need for assistance with ADLs?: Yes Does the patient have difficulty dressing or bathing?: Yes Independently performs ADLs?: No Communication: Independent Dressing (OT): Needs assistance Is this a change from baseline?: Pre-admission baseline Grooming: Needs assistance Is this a change from baseline?: Pre-admission baseline Feeding: Independent Bathing: Needs assistance Is this a change from baseline?: Pre-admission baseline Toileting: Needs assistance Is this a change from baseline?: Pre-admission baseline In/Out Bed: Independent Is this a change from baseline?: Pre-admission baseline Walks in Home: Independent with device (comment) (walker) Does the patient have difficulty walking or climbing stairs?: Yes Weakness of Legs: Both Weakness of Arms/Hands: Both  Permission Sought/Granted Permission sought to share information with : Family Supports       Permission granted to share info w AGENCY: [facility]        Emotional Assessment Appearance:: Appears stated age Attitude/Demeanor/Rapport: Engaged Affect (typically observed): Accepting Orientation: : Oriented to Self,Oriented to Place,Oriented to  Time,Oriented to Situation Alcohol / Substance Use: Not Applicable Psych Involvement: No (comment)  Admission diagnosis:  Pain [R52] CAP (community  acquired pneumonia) [J18.9] Altered mental status, unspecified altered mental  status type [R41.82] Patient Active Problem List   Diagnosis Date Noted  . CAP (community acquired pneumonia) 11/09/2020  . Sepsis (HCC) 11/09/2020  . Acute metabolic encephalopathy 11/09/2020  . Acute respiratory failure with hypoxia (HCC) 11/09/2020  . UTI (urinary tract infection) 11/09/2020  . HTN (hypertension)   . HLD (hyperlipidemia)   . Seizure-like activity (HCC)   . GERD (gastroesophageal reflux disease)   . TBI (traumatic brain injury) (HCC)   . Bipolar 1 disorder [facility])    PCP:  System, [name] Pharmacy:   [facility] - [place], [place] - [address] [facility] [address] [place] [address] Phone: [phone] Fax: [phone]     Social Determinants of Health (SDOH) Interventions    Readmission Risk Interventions No flowsheet data found.

## 2020-11-13 NOTE — Plan of Care (Signed)
Continuing with plan of care. 

## 2020-11-14 DIAGNOSIS — J189 Pneumonia, unspecified organism: Secondary | ICD-10-CM | POA: Diagnosis not present

## 2020-11-14 DIAGNOSIS — G9341 Metabolic encephalopathy: Secondary | ICD-10-CM | POA: Diagnosis not present

## 2020-11-14 DIAGNOSIS — J9601 Acute respiratory failure with hypoxia: Secondary | ICD-10-CM | POA: Diagnosis not present

## 2020-11-14 DIAGNOSIS — I10 Essential (primary) hypertension: Secondary | ICD-10-CM | POA: Diagnosis not present

## 2020-11-14 LAB — BASIC METABOLIC PANEL
Anion gap: 6 (ref 5–15)
BUN: 14 mg/dL (ref 8–23)
CO2: 30 mmol/L (ref 22–32)
Calcium: 9.1 mg/dL (ref 8.9–10.3)
Chloride: 102 mmol/L (ref 98–111)
Creatinine, Ser: 0.65 mg/dL (ref 0.44–1.00)
GFR, Estimated: >60 mL/min (ref >60)
Glucose, Bld: 110 mg/dL — ABNORMAL HIGH (ref 70–99)
Potassium: 3.8 mmol/L (ref 3.5–5.1)
Sodium: 138 mmol/L (ref 135–145)

## 2020-11-14 LAB — CULTURE, BLOOD (ROUTINE X 2)
Culture: NO GROWTH
Culture: NO GROWTH
Special Requests: ADEQUATE
Special Requests: ADEQUATE

## 2020-11-14 LAB — CBC
HCT: 31.2 % — ABNORMAL LOW (ref 36.0–46.0)
Hemoglobin: 9.7 g/dL — ABNORMAL LOW (ref 12.0–15.0)
MCH: 28.2 pg (ref 26.0–34.0)
MCHC: 31.1 g/dL (ref 30.0–36.0)
MCV: 90.7 fL (ref 80.0–100.0)
Platelets: 263 10*3/uL (ref 150–400)
RBC: 3.44 MIL/uL — ABNORMAL LOW (ref 3.87–5.11)
RDW: 13.8 % (ref 11.5–15.5)
WBC: 9.5 10*3/uL (ref 4.0–10.5)
nRBC: 0.2 % (ref 0.0–0.2)

## 2020-11-14 LAB — MAGNESIUM: Magnesium: 2 mg/dL (ref 1.7–2.4)

## 2020-11-14 LAB — PHOSPHORUS: Phosphorus: 3.7 mg/dL (ref 2.5–4.6)

## 2020-11-14 MED ORDER — AMLODIPINE BESYLATE 5 MG PO TABS
10.0000 mg | ORAL_TABLET | Freq: Every day | ORAL | Status: DC
  Administered 2020-11-14 – 2020-11-18 (×5): 10 mg via ORAL
  Filled 2020-11-14: qty 1
  Filled 2020-11-14: qty 2
  Filled 2020-11-14 (×3): qty 1

## 2020-11-14 MED ORDER — IPRATROPIUM-ALBUTEROL 0.5-2.5 (3) MG/3ML IN SOLN: 3.0000 mL | RESPIRATORY_TRACT | Status: DC | PRN

## 2020-11-14 MED ORDER — LOSARTAN POTASSIUM 25 MG PO TABS
100.0000 mg | ORAL_TABLET | Freq: Every day | ORAL | Status: DC
  Administered 2020-11-15 – 2020-11-18 (×4): 100 mg via ORAL
  Filled 2020-11-14: qty 2
  Filled 2020-11-14: qty 4
  Filled 2020-11-14 (×2): qty 2

## 2020-11-14 NOTE — Progress Notes (Signed)
PROGRESS NOTE    Debra Rich   DXA:[id number]  DOB: 10-28-1955  PCP: System, [name]    DOA: 11/09/2020 LOS: 5   Brief Narrative   65 yr old female with seizure-like activity(per Dr. Melrose Nakayama of neurology clinic note on 07/17/20), hypertension,bipolar disorderhyperlipidemia, GERD, anxiety, who presents with altered mental status and fever.  Upon arriving the emergency room, patient met sepsis criteria with leukocytosis, tachycardia, tachypnea and mild hypoxemia. Urine study was abnormal, chest x-ray showed left basilar infiltrates. Admitted and started on with cefepime and Zithromax for coverage of both UTI and pneumonia.  Assessment & Plan   Principal Problem:   CAP (community acquired pneumonia) Active Problems:   HTN (hypertension)   HLD (hyperlipidemia)   Seizure-like activity (HCC)   GERD (gastroesophageal reflux disease)   Sepsis (HCC)   Acute metabolic encephalopathy   Acute respiratory failure with hypoxia (HCC)   UTI (urinary tract infection)   Sepsis due to pneumonia and UTI - POA with leukocytosis, tachycardia, tachypnea in setting of UTI and PNA as per evaluation outlined above.  Urine culture multiple species present, suggestive recollection Blood culture NGTD, Covid negative --Continue Ceftin, Zithromax --Follow cultures to final --Supportive care: incentive spirometry, antitussives, o2  Acute metabolic encephalopathy / Hx of TBI Improving with antibiotics.   Patient still looks tired but she is more alert, AAO x3 Monitor  Ambulatory dysfunction / Generalized weakness - therapy recommending SNF.  TOC following.  Hx of TBI, lives at group home & must be 100% independent to return there.  PT recommended SNF, but pt refused.   Acutehypoxic respiratoryfailure secondary to pneumonia -  Possible COPD as well --Continue supplemental O2 inhalation and gradually wean off --Started Breo Ellipta and DuoNeb as needed   Hx of Seizure disorder -  Continued on home medicines. Seizure precautions.  Outpatient follow up as previously scheduled, sooner if issues with medications.   GERD -   HLD -    Obesity: Body mass index is 38.97 kg/m.  Complicates overall care and prognosis.  Recommend lifestyle modifications including physical activity and diet for weight loss and overall long-term health.   DVT prophylaxis:    Diet:  Diet Orders (From admission, onward)    Start     Ordered   11/09/20 1834  Diet Heart Room service appropriate? Yes; Fluid consistency: Thin  Diet effective now       Question Answer Comment  Room service appropriate? Yes   Fluid consistency: Thin      11/09/20 1833            Code Status: Full Code    Subjective 11/14/20    Patient seen and examined this morning.  Reports nausea and vomiting.  Tolerating water okay.  Is on clear liquid diet and/broth was okay but she got nauseous and started throwing up again after she drank grape juice.  Denies fevers or chills.  No cough or dysuria.  No other acute complaints.   Disposition Plan & Communication   Status is: Inpatient  Inpatient status remains appropriate as safe discharge precluded by need for SNF placement for short-term rehab.  Patient is unable to return to group home unless she is fully independent with ambulation and ADLs which she currently is not.  Wean off oxygen.  Dispo: The patient is from: Group home              Anticipated d/c is to: SNF  Patient currently is medically stable for discharge   Difficult to place patient no    Consults, Procedures, Significant Events   Consultants:  None  Procedures:   None  Antimicrobials:  Anti-infectives (From admission, onward)   Start     Dose/Rate Route Frequency Ordered Stop   11/13/20 1700  cefUROXime (CEFTIN) tablet 500 mg        500 mg Oral 2 times daily with meals 11/13/20 1152     11/10/20 0100  ceFEPIme (MAXIPIME) 2 g in sodium chloride 0.9 % 100 mL IVPB   Status:  Discontinued        2 g 200 mL/hr over 30 Minutes Intravenous Every 12 hours 11/09/20 1558 11/13/20 1152   11/09/20 1600  azithromycin (ZITHROMAX) 500 mg in sodium chloride 0.9 % 250 mL IVPB  Status:  Discontinued        500 mg 250 mL/hr over 60 Minutes Intravenous Every 24 hours 11/09/20 1513 11/13/20 1152   11/09/20 1230  vancomycin (VANCOREADY) IVPB 2000 mg/400 mL  Status:  Discontinued        2,000 mg 200 mL/hr over 120 Minutes Intravenous  Once 11/09/20 1210 11/09/20 1211   11/09/20 1215  aztreonam (AZACTAM) 2 g in sodium chloride 0.9 % 100 mL IVPB  Status:  Discontinued        2 g 200 mL/hr over 30 Minutes Intravenous  Once 11/09/20 1203 11/09/20 1209   11/09/20 1215  metroNIDAZOLE (FLAGYL) IVPB 500 mg        500 mg 100 mL/hr over 60 Minutes Intravenous  Once 11/09/20 1203 11/09/20 1249   11/09/20 1215  vancomycin (VANCOCIN) IVPB 1000 mg/200 mL premix  Status:  Discontinued        1,000 mg 200 mL/hr over 60 Minutes Intravenous  Once 11/09/20 1203 11/09/20 1210   11/09/20 1215  ceFEPIme (MAXIPIME) 2 g in sodium chloride 0.9 % 100 mL IVPB        2 g 200 mL/hr over 30 Minutes Intravenous  Once 11/09/20 1209 11/09/20 1344   11/09/20 1215  vancomycin (VANCOCIN) IVPB 1000 mg/200 mL premix       "Followed by" Linked Group Details   1,000 mg 200 mL/hr over 60 Minutes Intravenous  Once 11/09/20 1211 11/09/20 1511   11/09/20 1215  vancomycin (VANCOREADY) IVPB 1500 mg/300 mL       "Followed by" Linked Group Details   1,500 mg 150 mL/hr over 120 Minutes Intravenous  Once 11/09/20 1211 11/09/20 1713        Micro    Objective   Vitals:   11/13/20 2322 11/14/20 0443 11/14/20 0847 11/14/20 1547  BP: (!) 148/70 (!) 146/74  135/72  Pulse: 83 81 85 76  Resp: _0 Temp: 98.5 F (36.9 C) 98.6 F (37 C)  98.4 F (36.9 C)  TempSrc: Oral Oral    SpO2: 93% 93% 92%   Weight:      Height:        Intake/Output Summary (Last 24 hours) at 11/14/2020 1822 Last data filed  at 11/14/2020 1359 Gross per 24 hour  Intake 240 ml  Output 2101 ml  Net -1861 ml   Filed Weights   11/09/20 1155  Weight: 99.8 kg    Physical Exam:  General exam: awake, alert, no acute distress HEENT: moist mucus membranes, hearing grossly normal  Respiratory system: CTAB with diminished bases, no wheezes, rales or rhonchi, normal respiratory effort. Cardiovascular system: normal S1/S2, RRR, no pedal edema.  Gastrointestinal system: soft, NT, ND, +bowel sounds. Central nervous system: A&O x3. no gross focal neurologic deficits, normal speech Skin: dry, intact, normal temperature Psychiatry: normal mood, congruent affect, judgement and insight appear normal  Labs   Data Reviewed: I have personally reviewed following labs and imaging studies  CBC: Recent Labs  Lab 11/09/20 1200 11/10/20 0526 11/11/20 0532 11/12/20 0510 11/13/20 0517 11/14/20 0529  WBC 19.0* 19.0* 12.1* 9.4 7.7 9.5  NEUTROABS 14.7*  --  8.3*  --   --   --   HGB 11.3* 10.3* 9.2* 10.0* 9.3* 9.7*  HCT 34.9* 32.7* 29.8* 31.3* 29.8* 31.2*  MCV 87.9 89.8 90.9 89.4 90.3 90.7  PLT 211 180 162 166 195 979   Basic Metabolic Panel: Recent Labs  Lab 11/10/20 0526 11/11/20 0532 11/12/20 0510 11/13/20 0517 11/14/20 0529  NA 136 138 139 141 138  K 3.8 3.6 3.7 3.9 3.8  CL 104 105 103 104 102  CO2 _0 GLUCOSE 105* 99 118* 108* 110*  BUN _1 CREATININE 0.97 0.96 0.67 0.66 0.65  CALCIUM 8.2* 8.2* 8.5* 8.8* 9.1  MG  --  2.1 2.2 2.2 2.0  PHOS  --   --  2.8 3.6 3.7   GFR: Estimated Creatinine Clearance: 82.2 mL/min (by C-G formula based on SCr of 0.65 mg/dL). Liver Function Tests: Recent Labs  Lab 11/09/20 1200  AST 38  ALT 17  ALKPHOS 51  BILITOT 0.9  PROT 6.7  ALBUMIN 3.1*   No results for input(s): LIPASE, AMYLASE in the last 168 hours. No results for input(s): AMMONIA in the last 168 hours. Coagulation Profile: Recent Labs  Lab 11/09/20 1200  INR 1.1   Cardiac  Enzymes: No results for input(s): CKTOTAL, CKMB, CKMBINDEX, TROPONINI in the last 168 hours. BNP (last 3 results) No results for input(s): PROBNP in the last 8760 hours. HbA1C: No results for input(s): HGBA1C in the last 72 hours. CBG: No results for input(s): GLUCAP in the last 168 hours. Lipid Profile: No results for input(s): CHOL, HDL, LDLCALC, TRIG, CHOLHDL, LDLDIRECT in the last 72 hours. Thyroid Function Tests: No results for input(s): TSH, T4TOTAL, FREET4, T3FREE, THYROIDAB in the last 72 hours. Anemia Panel: No results for input(s): VITAMINB12, FOLATE, FERRITIN, TIBC, IRON, RETICCTPCT in the last 72 hours. Sepsis Labs: Recent Labs  Lab 11/09/20 1200  LATICACIDVEN 1.8    Recent Results (from the past 240 hour(s))  Culture, blood (Routine x 2)     Status: None   Collection Time: 11/09/20 12:00 PM   Specimen: BLOOD  Result Value Ref Range Status   Specimen Description BLOOD LEFT ANTECUBITAL  Final   Special Requests   Final    BOTTLES DRAWN AEROBIC AND ANAEROBIC Blood Culture adequate volume   Culture   Final    NO GROWTH 5 DAYS Performed at [facility], [address] [place], [place] [address]    Report Status 11/14/2020 FINAL  Final  Culture, blood (Routine x 2)     Status: None   Collection Time: 11/09/20 12:00 PM   Specimen: BLOOD  Result Value Ref Range Status   Specimen Description BLOOD RIGHT ANTECUBITAL  Final   Special Requests   Final    BOTTLES DRAWN AEROBIC AND ANAEROBIC Blood Culture adequate volume   Culture   Final    NO GROWTH 5 DAYS Performed at [facility], [address] [place], [place] [address]    Report Status  11/14/2020 FINAL  Final  Urine culture     Status: Abnormal   Collection Time: 11/09/20 12:03 PM   Specimen: In/Out Cath Urine  Result Value Ref Range Status   Specimen Description   Final    IN/OUT CATH URINE Performed at [facility], [address], [place], [place] [address]    Special  Requests   Final    NONE Performed at [facility], [address] [place], [place] [address]    Culture MULTIPLE SPECIES PRESENT, SUGGEST RECOLLECTION (A)  Final   Report Status 11/11/2020 FINAL  Final  Resp Panel by RT-PCR (Flu A&B, Covid) Nasopharyngeal Swab     Status: None   Collection Time: 11/09/20 12:23 PM   Specimen: Nasopharyngeal Swab; [name]    Comment: (NOTE) SARS-CoV-2 target nucleic acids are NOT DETECTED.  The SARS-CoV-2 RNA is generally detectable in upper respiratory specimens during the acute phase of infection. The lowest concentration of SARS-CoV-2 viral copies this assay can detect is 138 copies/mL. A negative result does not preclude SARS-Cov-2 infection and should not be used as the sole basis for treatment or other patient management decisions. A negative result may occur with  improper specimen collection/handling, submission of specimen other than nasopharyngeal swab, presence of viral mutation(s) within the areas targeted by this assay, and inadequate number of viral copies(<138 copies/mL). A negative result must be combined with clinical observations, patient history, and epidemiological information. The expected result is Negative.  Fact Sheet for Patients:  EntrepreneurPulse.com.au  Fact Sheet for Healthcare Providers:  IncredibleEmployment.be  This test is no t yet approved or cleared by the [place] FDA and  has been authorized for detection and/or diagnosis of SARS-CoV-2 by FDA under an Emergency Use Authorization (EUA). This EUA will remain  in effect (meaning this test can be used) for the duration of the COVID-19 declaration under Section 564(b)(1) of the Act, 21 U.S.C.section 360bbb-3(b)(1), unless the authorization is terminated  or revoked sooner.        Influenza A by PCR NEGATIVE NEGATIVE Final   Influenza B by PCR NEGATIVE NEGATIVE Final    Comment: (NOTE) The Xpert Xpress SARS-CoV-2/FLU/RSV plus assay is intended as an aid in the diagnosis of influenza from Nasopharyngeal swab specimens and should not be used as a sole basis for treatment. Nasal washings and aspirates are unacceptable for Xpert Xpress SARS-CoV-2/FLU/RSV testing.  Fact Sheet for Patients: EntrepreneurPulse.com.au  Fact Sheet for Healthcare Providers: IncredibleEmployment.be  This test is not yet approved or cleared by the [place] FDA and has been authorized for detection and/or diagnosis of SARS-CoV-2 by FDA under an Emergency Use Authorization (EUA). This EUA will remain in effect (meaning this test can be used) for the duration of the COVID-19 declaration under Section 564(b)(1) of the Act, 21 U.S.C. section 360bbb-3(b)(1), unless the authorization is terminated or revoked.  Performed at [facility], [address] [place], [place] [address]   CULTURE, BLOOD (ROUTINE X 2) w Reflex to ID Panel     Status: None (Preliminary result)   Collection Time: 11/11/20  8:42 AM   Specimen: BLOOD LEFT HAND  Result Value Ref Range Status   Specimen Description BLOOD LEFT HAND  Final   Special Requests   Final    BOTTLES DRAWN AEROBIC AND ANAEROBIC Blood Culture results may not be optimal due to an excessive  volume of blood received in culture bottles   Culture   Final    NO GROWTH 3 DAYS Performed at [facility], [address] [place], [place] [address]    Report Status PENDING  Incomplete  CULTURE, BLOOD (ROUTINE X 2) w Reflex to ID Panel     Status: None (Preliminary result)   Collection Time: 11/11/20  8:50 AM   Specimen: BLOOD RIGHT HAND  Result Value Ref Range Status   Specimen Description BLOOD RIGHT HAND  Final   Special Requests   Final    BOTTLES DRAWN AEROBIC AND ANAEROBIC Blood Culture  results may not be optimal due to an excessive volume of blood received in culture bottles   Culture   Final    NO GROWTH 3 DAYS Performed at [facility], [address], [place], [place] [address]    Report Status PENDING  Incomplete      Imaging Studies   No results found.   Medications   Scheduled Meds: . amLODipine  10 mg Oral Daily  . benztropine  0.5 mg Oral BID  . cefUROXime  500 mg Oral BID WC  . cholecalciferol  2,000 Units Oral Daily  . divalproex  1,000 mg Oral QHS  . enoxaparin (LOVENOX) injection  0.5 mg/kg Subcutaneous Q24H  . fluticasone furoate-vilanterol  1 puff Inhalation Daily  . loratadine  10 mg Oral Daily  . LORazepam  1 mg Oral Daily  . [START ON 11/15/2020] losartan  100 mg Oral Daily  . multivitamin with minerals  1 tablet Oral Daily  . pantoprazole  40 mg Oral Daily  . pravastatin  40 mg Oral QHS  . risperiDONE  1 mg Oral BID  . risperidone  4 mg Oral BID  . vitamin E  400 Units Oral Daily   Continuous Infusions:     LOS: 5 days    Time spent: 30 minutes    Ezekiel Slocumb, DO [facility]  11/14/2020, 6:22 PM      If 7PM-7AM, please contact night-coverage. How to contact the [facility] Attending or Consulting provider [place] or covering provider during after hours [place], for this patient?    1. Check the care team in [facility] and look for a) attending/consulting [facility] provider listed and b) the [facility] team listed 2. Log into [email or web address] and use 's universal password to access. If you do not have the password, please contact the hospital operator. 3. Locate the [facility] provider you are looking for under [facility] and page to a number that you can be directly reached. 4. If you still have difficulty reaching the provider, please page the [facility] (Director on Call) for the Hospitalists listed on amion for assistance.

## 2020-11-14 NOTE — Plan of Care (Signed)
Continuing with plan of care. 

## 2020-11-15 DIAGNOSIS — I10 Essential (primary) hypertension: Secondary | ICD-10-CM | POA: Diagnosis not present

## 2020-11-15 DIAGNOSIS — J189 Pneumonia, unspecified organism: Secondary | ICD-10-CM | POA: Diagnosis not present

## 2020-11-15 LAB — BASIC METABOLIC PANEL WITH GFR
Anion gap: 9 (ref 5–15)
BUN: 18 mg/dL (ref 8–23)
CO2: 28 mmol/L (ref 22–32)
Calcium: 9.5 mg/dL (ref 8.9–10.3)
Chloride: 103 mmol/L (ref 98–111)
Creatinine, Ser: 0.68 mg/dL (ref 0.44–1.00)
GFR, Estimated: >60 mL/min
Glucose, Bld: 117 mg/dL — ABNORMAL HIGH (ref 70–99)
Potassium: 3.9 mmol/L (ref 3.5–5.1)
Sodium: 140 mmol/L (ref 135–145)

## 2020-11-15 LAB — CBC
HCT: 34 % — ABNORMAL LOW (ref 36.0–46.0)
Hemoglobin: 10.8 g/dL — ABNORMAL LOW (ref 12.0–15.0)
MCH: 28.5 pg (ref 26.0–34.0)
MCHC: 31.8 g/dL (ref 30.0–36.0)
MCV: 89.7 fL (ref 80.0–100.0)
Platelets: 352 10*3/uL (ref 150–400)
RBC: 3.79 MIL/uL — ABNORMAL LOW (ref 3.87–5.11)
RDW: 13.8 % (ref 11.5–15.5)
WBC: 11.6 10*3/uL — ABNORMAL HIGH (ref 4.0–10.5)
nRBC: 0 % (ref 0.0–0.2)

## 2020-11-15 NOTE — Progress Notes (Signed)
PROGRESS NOTE    Debra Rich   RKY:[id number]  DOB: [birth year]  PCP: System, [name]    DOA: 11/09/2020 LOS: 6   Brief Narrative   65 yr old female with seizure-like activity(per Dr. Melrose Nakayama of neurology clinic note on 07/17/20), hypertension,bipolar disorderhyperlipidemia, GERD, anxiety, who presents with altered mental status and fever.  Upon arriving the emergency room, patient met sepsis criteria with leukocytosis, tachycardia, tachypnea and mild hypoxemia. Urine study was abnormal, chest x-ray showed left basilar infiltrates. Admitted and started on with cefepime and Zithromax for coverage of both UTI and pneumonia.  Assessment & Plan   Principal Problem:   CAP (community acquired pneumonia) Active Problems:   HTN (hypertension)   HLD (hyperlipidemia)   Seizure-like activity (HCC)   GERD (gastroesophageal reflux disease)   Sepsis (HCC)   Acute metabolic encephalopathy   Acute respiratory failure with hypoxia (HCC)   UTI (urinary tract infection)   Sepsis due to pneumonia and UTI - POA with leukocytosis, tachycardia, tachypnea in setting of UTI and PNA as per evaluation outlined above.  Urine culture multiple species present, suggestive recollection Blood culture NGTD, Covid negative --Continue Ceftin, Zithromax -to complete course today --Supportive care: incentive spirometry, antitussives, o2 if needed  Acute metabolic encephalopathy / Hx of TBI Improving with antibiotics.   Patient still looks tired but she is more alert, AAO x3 Monitor  Ambulatory dysfunction / Generalized weakness - therapy recommending SNF.  TOC following.  Hx of TBI, lives at group home & must be 100% independent to return there.  PT recommended SNF, but pt refused.   Acutehypoxic respiratoryfailure secondary to pneumonia -  Possible COPD as well --Continue supplemental O2 inhalation and gradually wean off --Started Breo Ellipta and DuoNeb as needed   Hx of Seizure  disorder - Continued on home medicines. Seizure precautions.  Outpatient follow up as previously scheduled, sooner if issues with medications.   GERD -continue Protonix  HLD -continue pravastatin   Obesity: Body mass index is 38.97 kg/m.  Complicates overall care and prognosis.  Recommend lifestyle modifications including physical activity and diet for weight loss and overall long-term health.   DVT prophylaxis: Lovenox   Diet:  Diet Orders (From admission, onward)    Start     Ordered   11/09/20 1834  Diet Heart Room service appropriate? Yes; Fluid consistency: Thin  Diet effective now       Question Answer Comment  Room service appropriate? Yes   Fluid consistency: Thin      11/09/20 1833            Code Status: Full Code    Subjective 11/15/20    Patient seen this morning, awake sitting up in bed.  She denies fevers or chills, pain, cough or congestion, nausea vomiting or other complaints.  Says she wants to return to her group home not go to rehab.  We discussed the reason for rehab recommendation at which point patient stopped making eye contact or conversing with me.   Disposition Plan & Communication   Status is: Inpatient  Inpatient status remains appropriate as safe discharge precluded by need for SNF placement for short-term rehab.  Patient is unable to return to group home unless she is fully independent with ambulation and ADLs which she currently is not.    Dispo: The patient is from: Group home              Anticipated d/c is to: SNF  Patient currently is medically stable for discharge   Difficult to place patient no    Consults, Procedures, Significant Events   Consultants:  None  Procedures:   None  Antimicrobials:  Anti-infectives (From admission, onward)   Start     Dose/Rate Route Frequency Ordered Stop   11/13/20 1700  cefUROXime (CEFTIN) tablet 500 mg        500 mg Oral 2 times daily with meals 11/13/20 1152 11/16/20 0759    11/10/20 0100  ceFEPIme (MAXIPIME) 2 g in sodium chloride 0.9 % 100 mL IVPB  Status:  Discontinued        2 g 200 mL/hr over 30 Minutes Intravenous Every 12 hours 11/09/20 1558 11/13/20 1152   11/09/20 1600  azithromycin (ZITHROMAX) 500 mg in sodium chloride 0.9 % 250 mL IVPB  Status:  Discontinued        500 mg 250 mL/hr over 60 Minutes Intravenous Every 24 hours 11/09/20 1513 11/13/20 1152   11/09/20 1230  vancomycin (VANCOREADY) IVPB 2000 mg/400 mL  Status:  Discontinued        2,000 mg 200 mL/hr over 120 Minutes Intravenous  Once 11/09/20 1210 11/09/20 1211   11/09/20 1215  aztreonam (AZACTAM) 2 g in sodium chloride 0.9 % 100 mL IVPB  Status:  Discontinued        2 g 200 mL/hr over 30 Minutes Intravenous  Once 11/09/20 1203 11/09/20 1209   11/09/20 1215  metroNIDAZOLE (FLAGYL) IVPB 500 mg        500 mg 100 mL/hr over 60 Minutes Intravenous  Once 11/09/20 1203 11/09/20 1249   11/09/20 1215  vancomycin (VANCOCIN) IVPB 1000 mg/200 mL premix  Status:  Discontinued        1,000 mg 200 mL/hr over 60 Minutes Intravenous  Once 11/09/20 1203 11/09/20 1210   11/09/20 1215  ceFEPIme (MAXIPIME) 2 g in sodium chloride 0.9 % 100 mL IVPB        2 g 200 mL/hr over 30 Minutes Intravenous  Once 11/09/20 1209 11/09/20 1344   11/09/20 1215  vancomycin (VANCOCIN) IVPB 1000 mg/200 mL premix       "Followed by" Linked Group Details   1,000 mg 200 mL/hr over 60 Minutes Intravenous  Once 11/09/20 1211 11/09/20 1511   11/09/20 1215  vancomycin (VANCOREADY) IVPB 1500 mg/300 mL       "Followed by" Linked Group Details   1,500 mg 150 mL/hr over 120 Minutes Intravenous  Once 11/09/20 1211 11/09/20 1713        Micro    Objective   Vitals:   11/14/20 2339 11/15/20 0451 11/15/20 1111 11/15/20 1612  BP: (!) 161/74 (!) 142/72 (!) 152/70 137/81  Pulse: 86 92 92 88  Resp:  (!) $Re'22 20 18  'uwV$ Temp: 98.4 F (36.9 C) 98.6 F (37 C) 98.2 F (36.8 C) 98.4 F (36.9 C)  TempSrc: Oral Oral Oral Oral  SpO2: 94%  96% 93% 95%  Weight:      Height:        Intake/Output Summary (Last 24 hours) at 11/15/2020 1832 Last data filed at 11/15/2020 1358 Gross per 24 hour  Intake 120 ml  Output 1100 ml  Net -980 ml   Filed Weights   11/09/20 1155  Weight: 99.8 kg    Physical Exam:  General exam: awake, alert, no acute distress, obese HEENT: moist mucus membranes, hearing grossly normal  Respiratory system: CTAB with diminished bases, no wheezes,  or rhonchi, normal respiratory effort. Cardiovascular system:  normal S1/S2, RRR, 1+ lower extremity edema.   Gastrointestinal system: soft, NT, ND, +bowel sounds. Central nervous system: Grossly nonfocal exam, normal speech Skin: dry, intact, normal temperature   Labs   Data Reviewed: I have personally reviewed following labs and imaging studies  CBC: Recent Labs  Lab 11/09/20 1200 11/10/20 0526 11/11/20 0532 11/12/20 0510 11/13/20 0517 11/14/20 0529 11/15/20 0545  WBC 19.0*   < > 12.1* 9.4 7.7 9.5 11.6*  NEUTROABS 14.7*  --  8.3*  --   --   --   --   HGB 11.3*   < > 9.2* 10.0* 9.3* 9.7* 10.8*  HCT 34.9*   < > 29.8* 31.3* 29.8* 31.2* 34.0*  MCV 87.9   < > 90.9 89.4 90.3 90.7 89.7  PLT 211   < > 162 166 195 263 352   < > = values in this interval not displayed.   Basic Metabolic Panel: Recent Labs  Lab 11/11/20 0532 11/12/20 0510 11/13/20 0517 11/14/20 0529 11/15/20 0545  NA 138 139 141 138 140  K 3.6 3.7 3.9 3.8 3.9  CL 105 103 104 102 103  CO2 $Re'27 30 31 30 28  'VUt$ GLUCOSE 99 118* 108* 110* 117*  BUN $Re'16 12 11 14 18  'mQi$ CREATININE 0.96 0.67 0.66 0.65 0.68  CALCIUM 8.2* 8.5* 8.8* 9.1 9.5  MG 2.1 2.2 2.2 2.0  --   PHOS  --  2.8 3.6 3.7  --    GFR: Estimated Creatinine Clearance: 82.2 mL/min (by C-G formula based on SCr of 0.68 mg/dL). Liver Function Tests: Recent Labs  Lab 11/09/20 1200  AST 38  ALT 17  ALKPHOS 51  BILITOT 0.9  PROT 6.7  ALBUMIN 3.1*   No results for input(s): LIPASE, AMYLASE in the last 168 hours. No results  for input(s): AMMONIA in the last 168 hours. Coagulation Profile: Recent Labs  Lab 11/09/20 1200  INR 1.1   Cardiac Enzymes: No results for input(s): CKTOTAL, CKMB, CKMBINDEX, TROPONINI in the last 168 hours. BNP (last 3 results) No results for input(s): PROBNP in the last 8760 hours. HbA1C: No results for input(s): HGBA1C in the last 72 hours. CBG: No results for input(s): GLUCAP in the last 168 hours. Lipid Profile: No results for input(s): CHOL, HDL, LDLCALC, TRIG, CHOLHDL, LDLDIRECT in the last 72 hours. Thyroid Function Tests: No results for input(s): TSH, T4TOTAL, FREET4, T3FREE, THYROIDAB in the last 72 hours. Anemia Panel: No results for input(s): VITAMINB12, FOLATE, FERRITIN, TIBC, IRON, RETICCTPCT in the last 72 hours. Sepsis Labs: Recent Labs  Lab 11/09/20 1200  LATICACIDVEN 1.8    Recent Results (from the past 240 hour(s))  Culture, blood (Routine x 2)     Status: None   Collection Time: 11/09/20 12:00 PM   Specimen: BLOOD  Result Value Ref Range Status   Specimen Description BLOOD LEFT ANTECUBITAL  Final   Special Requests   Final    BOTTLES DRAWN AEROBIC AND ANAEROBIC Blood Culture adequate volume   Culture   Final    NO GROWTH 5 DAYS Performed at [facility], [address], [place], [place] [address]    Report Status 11/14/2020 FINAL  Final  Culture, blood (Routine x 2)     Status: None   Collection Time: 11/09/20 12:00 PM   Specimen: BLOOD  Result Value Ref Range Status   Specimen Description BLOOD RIGHT ANTECUBITAL  Final   Special Requests   Final    BOTTLES DRAWN AEROBIC AND ANAEROBIC Blood Culture  adequate volume   Culture   Final    NO GROWTH 5 DAYS Performed at [facility], [address] [place], [place] [address]    Report Status 11/14/2020 FINAL  Final  Urine culture     Status: Abnormal   Collection Time: 11/09/20 12:03 PM   Specimen: In/Out Cath Urine  Result Value Ref Range Status   Specimen Description    Final    IN/OUT CATH URINE Performed at [facility], [address] [place], [place] [address]    Special Requests   Final    NONE Performed at [facility], [address] [place], [place] [address]    Culture MULTIPLE SPECIES PRESENT, SUGGEST RECOLLECTION (A)  Final   Report Status 11/11/2020 FINAL  Final  Resp Panel by RT-PCR (Flu A&B, Covid) Nasopharyngeal Swab     Status: None   Collection Time: 11/09/20 12:23 PM   Specimen: Nasopharyngeal Swab; [name]    Comment: (NOTE) SARS-CoV-2 target nucleic acids are NOT DETECTED.  The SARS-CoV-2 RNA is generally detectable in upper respiratory specimens during the acute phase of infection. The lowest concentration of SARS-CoV-2 viral copies this assay can detect is 138 copies/mL. A negative result does not preclude SARS-Cov-2 infection and should not be used as the sole basis for treatment or other patient management decisions. A negative result may occur with  improper specimen collection/handling, submission of specimen other than nasopharyngeal swab, presence of viral mutation(s) within the areas targeted by this assay, and inadequate number of viral copies(<138 copies/mL). A negative result must be combined with clinical observations, patient history, and epidemiological information. The expected result is Negative.  Fact Sheet for Patients:  EntrepreneurPulse.com.au  Fact Sheet for Healthcare Providers:  IncredibleEmployment.be  This test is no t yet approved or cleared by the [place] FDA and  has been authorized for detection and/or diagnosis of SARS-CoV-2 by FDA under an Emergency Use Authorization (EUA). This EUA will remain  in effect (meaning this test can be used) for the duration of the COVID-19 declaration under Section  564(b)(1) of the Act, 21 U.S.C.section 360bbb-3(b)(1), unless the authorization is terminated  or revoked sooner.       Influenza A by PCR NEGATIVE NEGATIVE Final   Influenza B by PCR NEGATIVE NEGATIVE Final    Comment: (NOTE) The Xpert Xpress SARS-CoV-2/FLU/RSV plus assay is intended as an aid in the diagnosis of influenza from Nasopharyngeal swab specimens and should not be used as a sole basis for treatment. Nasal washings and aspirates are unacceptable for Xpert Xpress SARS-CoV-2/FLU/RSV testing.  Fact Sheet for Patients: EntrepreneurPulse.com.au  Fact Sheet for Healthcare Providers: IncredibleEmployment.be  This test is not yet approved or cleared by the [place] FDA and has been authorized for detection and/or diagnosis of SARS-CoV-2 by FDA under an Emergency Use Authorization (EUA). This EUA will remain in effect (meaning this test can be used) for the duration of the COVID-19 declaration under Section 564(b)(1) of the Act, 21 U.S.C. section 360bbb-3(b)(1), unless the authorization is terminated or revoked.  Performed at [facility], [address] [place], [place] [address]   CULTURE, BLOOD (ROUTINE X 2) w Reflex to ID Panel     Status: None (Preliminary result)   Collection Time: 11/11/20  8:42 AM   Specimen: BLOOD LEFT HAND  Result Value Ref Range Status   Specimen  Description BLOOD LEFT HAND  Final   Special Requests   Final    BOTTLES DRAWN AEROBIC AND ANAEROBIC Blood Culture results may not be optimal due to an excessive volume of blood received in culture bottles   Culture   Final    NO GROWTH 4 DAYS Performed at [facility], [address] [place], [place] [address]    Report Status PENDING  Incomplete  CULTURE, BLOOD (ROUTINE X 2) w Reflex to ID Panel     Status: None (Preliminary result)   Collection Time: 11/11/20  8:50 AM   Specimen: BLOOD RIGHT HAND  Result Value Ref Range Status    Specimen Description BLOOD RIGHT HAND  Final   Special Requests   Final    BOTTLES DRAWN AEROBIC AND ANAEROBIC Blood Culture results may not be optimal due to an excessive volume of blood received in culture bottles   Culture   Final    NO GROWTH 4 DAYS Performed at [facility], [address], [place], [place] [address]    Report Status PENDING  Incomplete      Imaging Studies   No results found.   Medications   Scheduled Meds: . amLODipine  10 mg Oral Daily  . benztropine  0.5 mg Oral BID  . cefUROXime  500 mg Oral BID WC  . cholecalciferol  2,000 Units Oral Daily  . divalproex  1,000 mg Oral QHS  . enoxaparin (LOVENOX) injection  0.5 mg/kg Subcutaneous Q24H  . fluticasone furoate-vilanterol  1 puff Inhalation Daily  . loratadine  10 mg Oral Daily  . LORazepam  1 mg Oral Daily  . losartan  100 mg Oral Daily  . multivitamin with minerals  1 tablet Oral Daily  . pantoprazole  40 mg Oral Daily  . pravastatin  40 mg Oral QHS  . risperiDONE  1 mg Oral BID  . risperidone  4 mg Oral BID  . vitamin E  400 Units Oral Daily   Continuous Infusions:     LOS: 6 days    Time spent: 25 minutes with >50% spent at bedside and in coordination of care    Ezekiel Slocumb, DO [facility]  11/15/2020, 6:32 PM      If 7PM-7AM, please contact night-coverage. How to contact the [facility] Attending or Consulting provider [place] or covering provider during after hours [place], for this patient?    1. Check the care team in [facility] and look for a) attending/consulting [facility] provider listed and b) the [facility] team listed 2. Log into [email or web address] and use [place]'s universal password to access. If you do not have the password, please contact the hospital operator. 3. Locate the [facility] provider you are looking for under [facility] and page to a number that you can be directly reached. 4. If you still have difficulty reaching the provider, please page the [facility] (Director on  Call) for the Hospitalists listed on amion for assistance.

## 2020-11-15 NOTE — Plan of Care (Signed)
Continuing with plan of care. 

## 2020-11-16 DIAGNOSIS — K219 Gastro-esophageal reflux disease without esophagitis: Secondary | ICD-10-CM | POA: Diagnosis not present

## 2020-11-16 DIAGNOSIS — G9341 Metabolic encephalopathy: Secondary | ICD-10-CM | POA: Diagnosis not present

## 2020-11-16 DIAGNOSIS — J189 Pneumonia, unspecified organism: Secondary | ICD-10-CM | POA: Diagnosis not present

## 2020-11-16 DIAGNOSIS — J9601 Acute respiratory failure with hypoxia: Secondary | ICD-10-CM | POA: Diagnosis not present

## 2020-11-16 LAB — CBC
HCT: 34.5 % — ABNORMAL LOW (ref 36.0–46.0)
Hemoglobin: 10.6 g/dL — ABNORMAL LOW (ref 12.0–15.0)
MCH: 28.2 pg (ref 26.0–34.0)
MCHC: 30.7 g/dL (ref 30.0–36.0)
MCV: 91.8 fL (ref 80.0–100.0)
Platelets: 413 10*3/uL — ABNORMAL HIGH (ref 150–400)
RBC: 3.76 MIL/uL — ABNORMAL LOW (ref 3.87–5.11)
RDW: 14 % (ref 11.5–15.5)
WBC: 13.4 10*3/uL — ABNORMAL HIGH (ref 4.0–10.5)
nRBC: 0 % (ref 0.0–0.2)

## 2020-11-16 LAB — CULTURE, BLOOD (ROUTINE X 2)
Culture: NO GROWTH
Culture: NO GROWTH

## 2020-11-16 MED ORDER — DM-GUAIFENESIN ER 30-600 MG PO TB12
1.0000 | ORAL_TABLET | Freq: Two times a day (BID) | ORAL | Status: DC
  Administered 2020-11-16 – 2020-11-17 (×4): 1 via ORAL
  Filled 2020-11-16 (×4): qty 1

## 2020-11-16 NOTE — Care Management Important Message (Signed)
Important Message  Patient Details  Name: Debra Rich MRN: [id number] Date of Birth: [birth year]   Medicare Important Message Given:  Yes     [name] 11/16/2020, 12:04 PM

## 2020-11-16 NOTE — Progress Notes (Signed)
Physical Therapy Treatment Patient Details Name: Debra Rich MRN: [id number] DOB: [birth year] Today's Date: 11/16/2020    History of Present Illness Pt is a 65 y/o F admitted from group home on 11/09/20 with c/c of AMS & fever. Pt being treated for sepsis 2/2 CAP & UTI. PMH: TBI, seizure like activity, HTN, bipolar disorder, HLD, GERD, anxiety    PT Comments    Patient is making progress towards meeting functional goals. Patient continues to require physical assistance with mobility.  Patient ambulated 3 bouts of 20 ft with rolling walker and seated breaks breaks between bouts of walking due to fatigue with activity. Recommend to continue PT efforts to maximize independence and facilitate return to prior level of function. SNF is still an appropriate discharge plan, and patient states she was agreeable during this session.     Follow Up Recommendations  SNF;Supervision/Assistance - 24 hour     Equipment Recommendations  Rolling walker with 5" wheels;3in1 (PT)    Recommendations for Other Services       Precautions / Restrictions Precautions Precautions: Fall Restrictions Weight Bearing Restrictions: No    Mobility  Bed Mobility Overal bed mobility: Needs Assistance Bed Mobility: Supine to Sit;Sit to Supine     Supine to sit: Mod assist;HOB elevated Sit to supine: Mod assist   General bed mobility comments: assistance for trunk support to sit upright and assistance for BLE support to return to bed. verbal cues for task initiation and sequencing    Transfers Overall transfer level: Needs assistance Equipment used: Rolling walker (2 wheeled) Transfers: Sit to/from Stand Sit to Stand: Min assist         General transfer comment: lifting assistance required for standing, performed x 3 bouts. verbal cues for hand placement and safety with each transfer  Ambulation/Gait Ambulation/Gait assistance: Min guard Gait Distance (Feet):  (3 bouts of 20 ft each) Assistive  device: Rolling walker (2 wheeled) Gait Pattern/deviations: Step-through pattern;Decreased stride length;Trunk flexed Gait velocity: decreased   General Gait Details: verbal cues for safety. patient does have mild increased work of breathing on room air with ambulation. seated rest breaks required betewen bouts of ambulation   Stairs             Wheelchair Mobility    Modified Rankin (Stroke Patients Only)       Balance Overall balance assessment: Needs assistance Sitting-balance support: Feet supported;No upper extremity supported Sitting balance-Leahy Scale: Good     Standing balance support: Bilateral upper extremity supported Standing balance-Leahy Scale: Fair Standing balance comment: relying heavily on rolling walker for UE support                            Cognition Arousal/Alertness: Awake/alert Behavior During Therapy: Anxious;Flat affect Overall Cognitive Status: No family/caregiver present to determine baseline cognitive functioning                                 General Comments: patient is able to follow single step commands consistently      Exercises      General Comments        Pertinent Vitals/Pain Pain Assessment: No/denies pain    Home Living                      Prior Function            PT Goals (current  goals can now be found in the care plan section) Acute Rehab PT Goals Patient Stated Goal: to go home PT Goal Formulation: With patient Time For Goal Achievement: 11/27/20 Potential to Achieve Goals: Good Progress towards PT goals: Progressing toward goals    Frequency    Min 2X/week      PT Plan Current plan remains appropriate    Co-evaluation              AM-PAC PT "6 Clicks" Mobility   Outcome Measure  Help needed turning from your back to your side while in a flat bed without using bedrails?: A Little Help needed moving from lying on your back to sitting on the side of  a flat bed without using bedrails?: A Lot Help needed moving to and from a bed to a chair (including a wheelchair)?: A Little Help needed standing up from a chair using your arms (e.g., wheelchair or bedside chair)?: A Little Help needed to walk in hospital room?: A Little Help needed climbing 3-5 steps with a railing? : A Lot 6 Click Score: 16    End of Session Equipment Utilized During Treatment: Gait belt Activity Tolerance: Patient tolerated treatment well Patient left: in bed;with call bell/phone within reach;with bed alarm set   PT Visit Diagnosis: Unsteadiness on feet (R26.81);Difficulty in walking, not elsewhere classified (R26.2);Muscle weakness (generalized) (M62.81)     Time: 5284-1324 PT Time Calculation (min) (ACUTE ONLY): 23 min  Charges:  $Therapeutic Activity: 23-37 mins                     Donna Bernard, PT, MPT    Ina Homes 11/16/2020, 2:26 PM

## 2020-11-16 NOTE — Progress Notes (Signed)
Occupational Therapy Treatment Patient Details Name: Debra Rich MRN: [id number] DOB: 02-May-1956 Today's Date: 11/16/2020    History of present illness Pt is a 65 y/o F admitted from group home on 11/09/20 with c/c of AMS & fever. Pt being treated for sepsis 2/2 CAP & UTI. PMH: TBI, seizure like activity, HTN, bipolar disorder, HLD, GERD, anxiety   OT comments  Pt seen for OT treatment on this date. Upon arrival to room pt awake and seated upright in bed. Pt oriented to self, place, month/year (stated date was 19th), and situation, and reported no pain. Pt agreeable to tx. Pt required MIN GUARD-MIN A for supine<>sit transfers, MOD A for sit>stand transfer, and MIN GUARD for standing grooming tasks (requiring unilateral support on RW ~50% of time). Pt is making good progress toward goals and continues to benefit from skilled OT services to maximize return to PLOF and minimize risk of future falls, injury, caregiver burden, and readmission. Will continue to follow POC. Discharge recommendation remains appropriate.    Follow Up Recommendations  SNF    Equipment Recommendations  Other (comment) (defer)       Precautions / Restrictions Precautions Precautions: Fall Restrictions Weight Bearing Restrictions: No       Mobility Bed Mobility Overal bed mobility: Needs Assistance Bed Mobility: Supine to Sit;Sit to Supine     Supine to sit: Min assist Sit to supine: Min guard   General bed mobility comments: Pt using bedrails and requiring MIN A for trunk support during supine>sit. Able to perform sit>supine with increased time, HOB slightly elevated, and MIN GUARD    Transfers Overall transfer level: Needs assistance Equipment used: Rolling walker (2 wheeled) Transfers: Sit to/from Stand Sit to Stand: Mod assist         General transfer comment: lifting assistance required for standing. Required verbal cues for safe hand placement    Balance Overall balance assessment: Needs  assistance Sitting-balance support: Feet supported;No upper extremity supported Sitting balance-Leahy Scale: Good Sitting balance - Comments: Supervision sitting EOB to complete seated grooming tasks   Standing balance support: Single extremity supported;During functional activity Standing balance-Leahy Scale: Fair Standing balance comment: Pt required lifting assistance to stand, however able to maintain standing balance with MIN GUARD while performing standing ADLs with intermittent unilateral support on RW                           ADL either performed or assessed with clinical judgement   ADL Overall ADL's : Needs assistance/impaired     Grooming: Wash/dry face;Oral care;Sitting;Supervision/safety;Applying deodorant;Brushing hair;Min guard;Standing Grooming Details (indicate cue type and reason): SUPERVISION for seated ADLs at EOB and MIN GUARD for standing ADLs with unilateral support via RW                                               Cognition Arousal/Alertness: Awake/alert Behavior During Therapy: Flat affect Overall Cognitive Status: No family/caregiver present to determine baseline cognitive functioning                                 General Comments: Pt oriented to self, place, month/year (stated date was 19th), and situation. Able to follow single step commands consistently  Pertinent Vitals/ Pain       Pain Assessment: No/denies pain         Frequency  Min 1X/week        Progress Toward Goals  OT Goals(current goals can now be found in the care plan section)  Progress towards OT goals: Progressing toward goals  Acute Rehab OT Goals Patient Stated Goal: to go home OT Goal Formulation: With patient Time For Goal Achievement: 11/27/20 Potential to Achieve Goals: Fair  Plan Discharge plan remains appropriate;Frequency remains appropriate       AM-PAC OT "6 Clicks" Daily Activity      Outcome Measure   Help from another person eating meals?: None Help from another person taking care of personal grooming?: A Little Help from another person toileting, which includes using toliet, bedpan, or urinal?: A Lot Help from another person bathing (including washing, rinsing, drying)?: A Lot Help from another person to put on and taking off regular upper body clothing?: A Little Help from another person to put on and taking off regular lower body clothing?: A Lot 6 Click Score: 16    End of Session Equipment Utilized During Treatment: Gait belt;Rolling walker  OT Visit Diagnosis: Unsteadiness on feet (R26.81);Muscle weakness (generalized) (M62.81);Other symptoms and signs involving cognitive function   Activity Tolerance Patient tolerated treatment well   Patient Left in bed;with call bell/phone within reach;with bed alarm set   Nurse Communication Mobility status        Time: 2536-6440 OT Time Calculation (min): 23 min  Charges: OT General Charges $OT Visit: 1 Visit OT Treatments $Self Care/Home Management : 23-37 mins   Matthew Folks, OTR/L [facility] [phone]

## 2020-11-16 NOTE — Progress Notes (Signed)
PROGRESS NOTE    Debra Rich   VOH:[id number]  DOB: [birth year]  PCP: System, [name]    DOA: 11/09/2020 LOS: 7   Brief Narrative   65 yr old female with seizure-like activity(per Dr. Melrose Nakayama of neurology clinic note on 07/17/20), hypertension,bipolar disorderhyperlipidemia, GERD, anxiety, who presents with altered mental status and fever.  Upon arriving the emergency room, patient met sepsis criteria with leukocytosis, tachycardia, tachypnea and mild hypoxemia. Urine study was abnormal, chest x-ray showed left basilar infiltrates. Admitted and started on with cefepime and Zithromax for coverage of both UTI and pneumonia.  Assessment & Plan   Principal Problem:   CAP (community acquired pneumonia) Active Problems:   HTN (hypertension)   HLD (hyperlipidemia)   Seizure-like activity (HCC)   GERD (gastroesophageal reflux disease)   Sepsis (HCC)   Acute metabolic encephalopathy   Acute respiratory failure with hypoxia (HCC)   UTI (urinary tract infection)   Sepsis due to pneumonia and UTI - POA with leukocytosis, tachycardia, tachypnea in setting of UTI and PNA as per evaluation outlined above.  Urine culture multiple species present, suggestive recollection Blood culture NGTD, Covid negative --Completed course of Abx. But worsening leukocytosis. Will recheck CXR  in am --Supportive care: incentive spirometry, antitussives, o2 if needed  Acute metabolic encephalopathy / Hx of TBI Improving with antibiotics.   Patient still looks tired but she is more alert, AAO x3 Monitor  Ambulatory dysfunction / Generalized weakness - therapy recommending SNF.  TOC following.  Hx of TBI, lives at group home & must be 100% independent to return there.  PT recommended SNF, but pt refuses. TOC working on placement   Acutehypoxic respiratoryfailure secondary to pneumonia -  Possible COPD as well --Continue supplemental O2 inhalation and gradually wean off --continue Breo  Ellipta and DuoNeb as needed   Hx of Seizure disorder - Continued on home medicines. Seizure precautions.  Outpatient follow up as previously scheduled, sooner if issues with medications.   GERD -continue Protonix  HLD -continue pravastatin   Obesity: Body mass index is 38.97 kg/m.  Complicates overall care and prognosis.  Recommend lifestyle modifications including physical activity and diet for weight loss and overall long-term health.   DVT prophylaxis: Lovenox   Diet:  Diet Orders (From admission, onward)    Start     Ordered   11/09/20 1834  Diet Heart Room service appropriate? Yes; Fluid consistency: Thin  Diet effective now       Question Answer Comment  Room service appropriate? Yes   Fluid consistency: Thin      11/09/20 1833            Code Status: Full Code    Subjective 11/16/20    Denies any c/o. Wants [place] to talk with her family about placement. She prefers to return group home  Disposition Plan & Communication   Status is: Inpatient  Inpatient status remains appropriate as safe discharge precluded by need for SNF placement for short-term rehab.  Patient is unable to return to group home unless she is fully independent with ambulation and ADLs which she currently is not.    Dispo: The patient is from: Group home              Anticipated d/c is to: SNF              Patient currently is medically stable for discharge - TOC working on placement   Difficult to place patient no    Consults, Procedures,  Significant Events   Consultants:  None  Procedures:   None  Antimicrobials:  Anti-infectives (From admission, onward)   Start     Dose/Rate Route Frequency Ordered Stop   11/13/20 1700  cefUROXime (CEFTIN) tablet 500 mg        500 mg Oral 2 times daily with meals 11/13/20 1152 11/16/20 0759   11/10/20 0100  ceFEPIme (MAXIPIME) 2 g in sodium chloride 0.9 % 100 mL IVPB  Status:  Discontinued        2 g 200 mL/hr over 30 Minutes Intravenous  Every 12 hours 11/09/20 1558 11/13/20 1152   11/09/20 1600  azithromycin (ZITHROMAX) 500 mg in sodium chloride 0.9 % 250 mL IVPB  Status:  Discontinued        500 mg 250 mL/hr over 60 Minutes Intravenous Every 24 hours 11/09/20 1513 11/13/20 1152   11/09/20 1230  vancomycin (VANCOREADY) IVPB 2000 mg/400 mL  Status:  Discontinued        2,000 mg 200 mL/hr over 120 Minutes Intravenous  Once 11/09/20 1210 11/09/20 1211   11/09/20 1215  aztreonam (AZACTAM) 2 g in sodium chloride 0.9 % 100 mL IVPB  Status:  Discontinued        2 g 200 mL/hr over 30 Minutes Intravenous  Once 11/09/20 1203 11/09/20 1209   11/09/20 1215  metroNIDAZOLE (FLAGYL) IVPB 500 mg        500 mg 100 mL/hr over 60 Minutes Intravenous  Once 11/09/20 1203 11/09/20 1249   11/09/20 1215  vancomycin (VANCOCIN) IVPB 1000 mg/200 mL premix  Status:  Discontinued        1,000 mg 200 mL/hr over 60 Minutes Intravenous  Once 11/09/20 1203 11/09/20 1210   11/09/20 1215  ceFEPIme (MAXIPIME) 2 g in sodium chloride 0.9 % 100 mL IVPB        2 g 200 mL/hr over 30 Minutes Intravenous  Once 11/09/20 1209 11/09/20 1344   11/09/20 1215  vancomycin (VANCOCIN) IVPB 1000 mg/200 mL premix       "Followed by" Linked Group Details   1,000 mg 200 mL/hr over 60 Minutes Intravenous  Once 11/09/20 1211 11/09/20 1511   11/09/20 1215  vancomycin (VANCOREADY) IVPB 1500 mg/300 mL       "Followed by" Linked Group Details   1,500 mg 150 mL/hr over 120 Minutes Intravenous  Once 11/09/20 1211 11/09/20 1713        Micro    Objective   Vitals:   11/16/20 1114 11/16/20 1233 11/16/20 1629 11/16/20 2122  BP: (!) 151/75 135/69 (!) 130/57 136/74  Pulse: 85 74 82 79  Resp: $Remo'16 20 16 20  'MxSdi$ Temp: 98.1 F (36.7 C) 98 F (36.7 C) 98.2 F (36.8 C) 98.1 F (36.7 C)  TempSrc: Oral  Oral Oral  SpO2: 92% 93% 91% 92%  Weight:      Height:        Intake/Output Summary (Last 24 hours) at 11/16/2020 2143 Last data filed at 11/16/2020 2137 Gross per 24 hour   Intake 720 ml  Output 1200 ml  Net -480 ml   Filed Weights   11/09/20 1155  Weight: 99.8 kg    Physical Exam:  General exam: awake, alert, no acute distress, obese HEENT: moist mucus membranes, hearing grossly normal  Respiratory system: CTAB with diminished bases, no wheezes,  or rhonchi, normal respiratory effort. Cardiovascular system: normal S1/S2, RRR, 1+ lower extremity edema.   Gastrointestinal system: soft, NT, ND, +bowel sounds. Central nervous system: Grossly nonfocal  exam, normal speech Skin: dry, intact, normal temperature   Labs   Data Reviewed: I have personally reviewed following labs and imaging studies  CBC: Recent Labs  Lab 11/11/20 0532 11/12/20 0510 11/13/20 0517 11/14/20 0529 11/15/20 0545 11/16/20 0453  WBC 12.1* 9.4 7.7 9.5 11.6* 13.4*  NEUTROABS 8.3*  --   --   --   --   --   HGB 9.2* 10.0* 9.3* 9.7* 10.8* 10.6*  HCT 29.8* 31.3* 29.8* 31.2* 34.0* 34.5*  MCV 90.9 89.4 90.3 90.7 89.7 91.8  PLT 162 166 195 263 352 829*   Basic Metabolic Panel: Recent Labs  Lab 11/11/20 0532 11/12/20 0510 11/13/20 0517 11/14/20 0529 11/15/20 0545  NA 138 139 141 138 140  K 3.6 3.7 3.9 3.8 3.9  CL 105 103 104 102 103  CO2 $Re'27 30 31 30 28  'PWF$ GLUCOSE 99 118* 108* 110* 117*  BUN $Re'16 12 11 14 18  'gAq$ CREATININE 0.96 0.67 0.66 0.65 0.68  CALCIUM 8.2* 8.5* 8.8* 9.1 9.5  MG 2.1 2.2 2.2 2.0  --   PHOS  --  2.8 3.6 3.7  --    GFR: Estimated Creatinine Clearance: 82.2 mL/min (by C-G formula based on SCr of 0.68 mg/dL). Liver Function Tests: No results for input(s): AST, ALT, ALKPHOS, BILITOT, PROT, ALBUMIN in the last 168 hours. No results for input(s): LIPASE, AMYLASE in the last 168 hours. No results for input(s): AMMONIA in the last 168 hours. Coagulation Profile: No results for input(s): INR, PROTIME in the last 168 hours. Cardiac Enzymes: No results for input(s): CKTOTAL, CKMB, CKMBINDEX, TROPONINI in the last 168 hours. BNP (last 3 results) No results for  input(s): PROBNP in the last 8760 hours. HbA1C: No results for input(s): HGBA1C in the last 72 hours. CBG: No results for input(s): GLUCAP in the last 168 hours. Lipid Profile: No results for input(s): CHOL, HDL, LDLCALC, TRIG, CHOLHDL, LDLDIRECT in the last 72 hours. Thyroid Function Tests: No results for input(s): TSH, T4TOTAL, FREET4, T3FREE, THYROIDAB in the last 72 hours. Anemia Panel: No results for input(s): VITAMINB12, FOLATE, FERRITIN, TIBC, IRON, RETICCTPCT in the last 72 hours. Sepsis Labs: No results for input(s): PROCALCITON, LATICACIDVEN in the last 168 hours.  Recent Results (from the past 240 hour(s))  Culture, blood (Routine x 2)     Status: None   Collection Time: 11/09/20 12:00 PM   Specimen: BLOOD  Result Value Ref Range Status   Specimen Description BLOOD LEFT ANTECUBITAL  Final   Special Requests   Final    BOTTLES DRAWN AEROBIC AND ANAEROBIC Blood Culture adequate volume   Culture   Final    NO GROWTH 5 DAYS Performed at [facility], [address] [place], [place] [address]    Report Status 11/14/2020 FINAL  Final  Culture, blood (Routine x 2)     Status: None   Collection Time: 11/09/20 12:00 PM   Specimen: BLOOD  Result Value Ref Range Status   Specimen Description BLOOD RIGHT ANTECUBITAL  Final   Special Requests   Final    BOTTLES DRAWN AEROBIC AND ANAEROBIC Blood Culture adequate volume   Culture   Final    NO GROWTH 5 DAYS Performed at [facility], [address] [place], [place] [address]    Report Status 11/14/2020 FINAL  Final  Urine culture     Status: Abnormal   Collection Time: 11/09/20 12:03 PM   Specimen: In/Out Cath Urine  Result Value Ref Range Status  Specimen Description   Final    IN/OUT CATH URINE Performed at [facility], [address] [place], [place] [address]    Special Requests   Final    NONE Performed at [facility], [address] [place], [place] [address]     Culture MULTIPLE SPECIES PRESENT, SUGGEST RECOLLECTION (A)  Final   Report Status 11/11/2020 FINAL  Final  Resp Panel by RT-PCR (Flu A&B, Covid) Nasopharyngeal Swab     Status: None   Collection Time: 11/09/20 12:23 PM   Specimen: Nasopharyngeal Swab; [name]    Comment: (NOTE) SARS-CoV-2 target nucleic acids are NOT DETECTED.  The SARS-CoV-2 RNA is generally detectable in upper respiratory specimens during the acute phase of infection. The lowest concentration of SARS-CoV-2 viral copies this assay can detect is 138 copies/mL. A negative result does not preclude SARS-Cov-2 infection and should not be used as the sole basis for treatment or other patient management decisions. A negative result may occur with  improper specimen collection/handling, submission of specimen other than nasopharyngeal swab, presence of viral mutation(s) within the areas targeted by this assay, and inadequate number of viral copies(<138 copies/mL). A negative result must be combined with clinical observations, patient history, and epidemiological information. The expected result is Negative.  Fact Sheet for Patients:  EntrepreneurPulse.com.au  Fact Sheet for Healthcare Providers:  IncredibleEmployment.be  This test is no t yet approved or cleared by the [place] FDA and  has been authorized for detection and/or diagnosis of SARS-CoV-2 by FDA under an Emergency Use Authorization (EUA). This EUA will remain  in effect (meaning this test can be used) for the duration of the COVID-19 declaration under Section 564(b)(1) of the Act, 21 U.S.C.section 360bbb-3(b)(1), unless the authorization is terminated  or revoked sooner.       Influenza A by PCR NEGATIVE NEGATIVE Final   Influenza B by PCR NEGATIVE NEGATIVE Final    Comment: (NOTE) The  Xpert Xpress SARS-CoV-2/FLU/RSV plus assay is intended as an aid in the diagnosis of influenza from Nasopharyngeal swab specimens and should not be used as a sole basis for treatment. Nasal washings and aspirates are unacceptable for Xpert Xpress SARS-CoV-2/FLU/RSV testing.  Fact Sheet for Patients: EntrepreneurPulse.com.au  Fact Sheet for Healthcare Providers: IncredibleEmployment.be  This test is not yet approved or cleared by the [place] FDA and has been authorized for detection and/or diagnosis of SARS-CoV-2 by FDA under an Emergency Use Authorization (EUA). This EUA will remain in effect (meaning this test can be used) for the duration of the COVID-19 declaration under Section 564(b)(1) of the Act, 21 U.S.C. section 360bbb-3(b)(1), unless the authorization is terminated or revoked.  Performed at [facility], [address] [place], [place] [address]   CULTURE, BLOOD (ROUTINE X 2) w Reflex to ID Panel     Status: None   Collection Time: 11/11/20  8:42 AM   Specimen: BLOOD LEFT HAND  Result Value Ref Range Status   Specimen Description BLOOD LEFT HAND  Final   Special Requests   Final    BOTTLES DRAWN AEROBIC AND ANAEROBIC Blood Culture results may not be optimal due to an excessive volume of blood received in culture bottles   Culture   Final    NO GROWTH 5 DAYS Performed at [facility], [address], [place], [place] [address]    Report  Status 11/16/2020 FINAL  Final  CULTURE, BLOOD (ROUTINE X 2) w Reflex to ID Panel     Status: None   Collection Time: 11/11/20  8:50 AM   Specimen: BLOOD RIGHT HAND  Result Value Ref Range Status   Specimen Description BLOOD RIGHT HAND  Final   Special Requests   Final    BOTTLES DRAWN AEROBIC AND ANAEROBIC Blood Culture results may not be optimal due to an excessive volume of blood received in culture bottles   Culture   Final    NO GROWTH 5 DAYS Performed at [facility], [address] [place], [place] [address]    Report Status 11/16/2020 FINAL  Final      Imaging Studies   No results found.   Medications   Scheduled Meds: . amLODipine  10 mg Oral Daily  . benztropine  0.5 mg Oral BID  . cholecalciferol  2,000 Units Oral Daily  . dextromethorphan-guaiFENesin  1 tablet Oral BID  . divalproex  1,000 mg Oral QHS  . enoxaparin (LOVENOX) injection  0.5 mg/kg Subcutaneous Q24H  . fluticasone furoate-vilanterol  1 puff Inhalation Daily  . loratadine  10 mg Oral Daily  . LORazepam  1 mg Oral Daily  . losartan  100 mg Oral Daily  . multivitamin with minerals  1 tablet Oral Daily  . pantoprazole  40 mg Oral Daily  . pravastatin  40 mg Oral QHS  . risperiDONE  1 mg Oral BID  . risperidone  4 mg Oral BID  . Vitamin E  400 Units Oral Daily   Continuous Infusions:     LOS: 7 days    Time spent: 25 minutes with >50% spent at bedside and in coordination of care    [place], DO [facility]  11/16/2020, 9:43 PM      If 7PM-7AM, please contact night-coverage. How to contact the [facility] Attending or Consulting provider [place] or covering provider during after hours [place], for this patient?    1. Check the care team in [facility] and look for a) attending/consulting TR[facility] provider listed and b) the [facility] team listed 2. Log into [email or web address] and use [place]'s universal password to access. If you do not have the password, please contact the hospital operator. 3. Locate the [facility] provider you are looking for under [facility] and page to a number that you can be directly reached. 4. If you still have difficulty reaching the provider, please page the [facility] (Director on Call) for the Hospitalists listed on amion for assistance.

## 2020-11-16 NOTE — TOC Progression Note (Addendum)
Transition of Care [facility]) - Progression Note    Patient Details  Name: Debra Rich MRN: [id number] Date of Birth: 1956-08-06  Transition of Care [employer] [facility]) CM/SW Contact  Debra Krabbe, LCSW Phone Number: 11/16/2020, 9:39 AM  Clinical Narrative:  Pt is still stating she is not going to rehab. CSW explained the group home will not take her back with her needing physical assistance--and the need for SNF. CSW explained pt would have to find a alternative place to d/c to. Pt the states "you will have to call my family." CSW explained there was only one individual listed on pt's contact list. Pt provided CSW with sister Debra Rich contact-[phone]. CSW called and left a voicemail. Pt also attempted the number in the chart with no answer.     Expected Discharge Plan: Group Home Barriers to Discharge: Continued Medical Work up  Expected Discharge Plan and Services Expected Discharge Plan: Group Home In-house Referral: NA   Post Acute Care Choice: Home Health Living arrangements for the past 2 months: Group Home                 DME Arranged: Debra Rich DME Agency: [employer] Date DME Agency Contacted: 11/13/20 Time DME Agency Contacted: 0017 Representative spoke with at DME Agency: Debra Rich Arranged: PT,OT Rich Agency: [facility] Date Rich Agency Contacted: 11/13/20 Time Rich Agency Contacted: 1508 Representative spoke with at [facility] Agency: Debra Rich   Social Determinants of Health (SDOH) Interventions    Readmission Risk Interventions No flowsheet data found.

## 2020-11-17 ENCOUNTER — Inpatient Hospital Stay: Payer: Medicare Other

## 2020-11-17 DIAGNOSIS — J189 Pneumonia, unspecified organism: Secondary | ICD-10-CM | POA: Diagnosis not present

## 2020-11-17 DIAGNOSIS — K219 Gastro-esophageal reflux disease without esophagitis: Secondary | ICD-10-CM | POA: Diagnosis not present

## 2020-11-17 DIAGNOSIS — G9341 Metabolic encephalopathy: Secondary | ICD-10-CM | POA: Diagnosis not present

## 2020-11-17 DIAGNOSIS — J9601 Acute respiratory failure with hypoxia: Secondary | ICD-10-CM | POA: Diagnosis not present

## 2020-11-17 LAB — BASIC METABOLIC PANEL
Anion gap: 8 (ref 5–15)
BUN: 21 mg/dL (ref 8–23)
CO2: 28 mmol/L (ref 22–32)
Calcium: 9.6 mg/dL (ref 8.9–10.3)
Chloride: 105 mmol/L (ref 98–111)
Creatinine, Ser: 0.74 mg/dL (ref 0.44–1.00)
GFR, Estimated: >60 mL/min (ref >60)
Glucose, Bld: 106 mg/dL — ABNORMAL HIGH (ref 70–99)
Potassium: 4.1 mmol/L (ref 3.5–5.1)
Sodium: 141 mmol/L (ref 135–145)

## 2020-11-17 LAB — CBC
HCT: 35.8 % — ABNORMAL LOW (ref 36.0–46.0)
Hemoglobin: 11.2 g/dL — ABNORMAL LOW (ref 12.0–15.0)
MCH: 28.2 pg (ref 26.0–34.0)
MCHC: 31.3 g/dL (ref 30.0–36.0)
MCV: 90.2 fL (ref 80.0–100.0)
Platelets: 444 10*3/uL — ABNORMAL HIGH (ref 150–400)
RBC: 3.97 MIL/uL (ref 3.87–5.11)
RDW: 14.1 % (ref 11.5–15.5)
WBC: 13.8 10*3/uL — ABNORMAL HIGH (ref 4.0–10.5)
nRBC: 0.1 % (ref 0.0–0.2)

## 2020-11-17 LAB — RESP PANEL BY RT-PCR (FLU A&B, COVID) ARPGX2
Influenza A by PCR: NEGATIVE
Influenza B by PCR: NEGATIVE
SARS Coronavirus 2 by RT PCR: NEGATIVE

## 2020-11-17 NOTE — Progress Notes (Signed)
PROGRESS NOTE    Debra Rich   VZD:[id number]  DOB: 1955-12-16  PCP: System, [name]    DOA: 11/09/2020 LOS: 8   Brief Narrative   64 yr old female with seizure-like activity(per Dr. Melrose Nakayama of neurology clinic note on 07/17/20), hypertension,bipolar disorderhyperlipidemia, GERD, anxiety, who presents with altered mental status and fever.  Upon arriving the emergency room, patient met sepsis criteria with leukocytosis, tachycardia, tachypnea and mild hypoxemia. Urine study was abnormal, chest x-ray showed left basilar infiltrates. Admitted and started on with cefepime and Zithromax for coverage of both UTI and pneumonia.  Assessment & Plan   Principal Problem:   CAP (community acquired pneumonia) Active Problems:   HTN (hypertension)   HLD (hyperlipidemia)   Seizure-like activity (HCC)   GERD (gastroesophageal reflux disease)   Sepsis (HCC)   Acute metabolic encephalopathy   Acute respiratory failure with hypoxia (HCC)   UTI (urinary tract infection)   Sepsis due to pneumonia and UTI - POA with leukocytosis, tachycardia, tachypnea in setting of UTI and PNA as per evaluation outlined above.  Urine culture multiple species present, suggestive recollection Blood culture NGTD, Covid negative --Completed course of Abx. But worsening leukocytosis.recheck CXR today shows no acute cardiopulmonary disease --Supportive care: incentive spirometry, antitussives, o2 if needed  Acute metabolic encephalopathy / Hx of TBI Improving with antibiotics.   Patient still looks tired but she is more alert, AAO x3 Monitor  Ambulatory dysfunction / Generalized weakness - therapy recommending SNF.  TOC following.  Hx of TBI, lives at group home & must be 100% independent to return there.  PT recommended SNF, but pt refuses. TOC working on placement   Acutehypoxic respiratoryfailure secondary to pneumonia -  Possible COPD as well --Continue supplemental O2 inhalation and gradually  wean off --continue Breo Ellipta and DuoNeb as needed  Hx of Seizure disorder - Continued on home medicines. Seizure precautions.  Outpatient follow up as previously scheduled, sooner if issues with medications.  GERD -continue Protonix  HLD -continue pravastatin   Obesity: Body mass index is 38.97 kg/m.  Complicates overall care and prognosis.  Recommend lifestyle modifications including physical activity and diet for weight loss and overall long-term health.   DVT prophylaxis: Lovenox   Diet:  Diet Orders (From admission, onward)    Start     Ordered   11/09/20 1834  Diet Heart Room service appropriate? Yes; Fluid consistency: Thin  Diet effective now       Question Answer Comment  Room service appropriate? Yes   Fluid consistency: Thin      11/09/20 1833            Code Status: Full Code    Subjective 11/17/20    Denies any c/o.  Patient now agreeable to go to SNF but does not want to go today and requesting 1 more night stay  Disposition Plan & Communication   Status is: Inpatient  Inpatient status remains appropriate as safe discharge precluded by need for SNF placement for short-term rehab.  Patient is unable to return to group home unless she is fully independent with ambulation and ADLs which she currently is not.    Dispo: The patient is from: Group home              Anticipated d/c is to: SNF              Patient currently is medically stable for discharge - TOC working on placement   Difficult to place patient no  Consults, Procedures, Significant Events   Consultants:  None  Procedures:   None  Antimicrobials:  Anti-infectives (From admission, onward)   Start     Dose/Rate Route Frequency Ordered Stop   11/13/20 1700  cefUROXime (CEFTIN) tablet 500 mg        500 mg Oral 2 times daily with meals 11/13/20 1152 11/16/20 0759   11/10/20 0100  ceFEPIme (MAXIPIME) 2 g in sodium chloride 0.9 % 100 mL IVPB  Status:  Discontinued        2  g 200 mL/hr over 30 Minutes Intravenous Every 12 hours 11/09/20 1558 11/13/20 1152   11/09/20 1600  azithromycin (ZITHROMAX) 500 mg in sodium chloride 0.9 % 250 mL IVPB  Status:  Discontinued        500 mg 250 mL/hr over 60 Minutes Intravenous Every 24 hours 11/09/20 1513 11/13/20 1152   11/09/20 1230  vancomycin (VANCOREADY) IVPB 2000 mg/400 mL  Status:  Discontinued        2,000 mg 200 mL/hr over 120 Minutes Intravenous  Once 11/09/20 1210 11/09/20 1211   11/09/20 1215  aztreonam (AZACTAM) 2 g in sodium chloride 0.9 % 100 mL IVPB  Status:  Discontinued        2 g 200 mL/hr over 30 Minutes Intravenous  Once 11/09/20 1203 11/09/20 1209   11/09/20 1215  metroNIDAZOLE (FLAGYL) IVPB 500 mg        500 mg 100 mL/hr over 60 Minutes Intravenous  Once 11/09/20 1203 11/09/20 1249   11/09/20 1215  vancomycin (VANCOCIN) IVPB 1000 mg/200 mL premix  Status:  Discontinued        1,000 mg 200 mL/hr over 60 Minutes Intravenous  Once 11/09/20 1203 11/09/20 1210   11/09/20 1215  ceFEPIme (MAXIPIME) 2 g in sodium chloride 0.9 % 100 mL IVPB        2 g 200 mL/hr over 30 Minutes Intravenous  Once 11/09/20 1209 11/09/20 1344   11/09/20 1215  vancomycin (VANCOCIN) IVPB 1000 mg/200 mL premix       "Followed by" Linked Group Details   1,000 mg 200 mL/hr over 60 Minutes Intravenous  Once 11/09/20 1211 11/09/20 1511   11/09/20 1215  vancomycin (VANCOREADY) IVPB 1500 mg/300 mL       "Followed by" Linked Group Details   1,500 mg 150 mL/hr over 120 Minutes Intravenous  Once 11/09/20 1211 11/09/20 1713        Micro    Objective   Vitals:   11/16/20 2338 11/17/20 0508 11/17/20 0751 11/17/20 1210  BP: (!) 148/75 (!) 152/78 139/70 140/72  Pulse: 80 74 73 73  Resp: $Remo'20 20 20 18  'nrPnU$ Temp: 98.1 F (36.7 C) 97.8 F (36.6 C) 98.4 F (36.9 C) 98.2 F (36.8 C)  TempSrc: Oral Oral Oral Oral  SpO2: 93% 93% 92% 93%  Weight:      Height:        Intake/Output Summary (Last 24 hours) at 11/17/2020 1605 Last data  filed at 11/17/2020 0900 Gross per 24 hour  Intake 480 ml  Output 1900 ml  Net -1420 ml   Filed Weights   11/09/20 1155  Weight: 99.8 kg    Physical Exam:  General exam: awake, alert, no acute distress, obese HEENT: moist mucus membranes, hearing grossly normal  Respiratory system: CTAB with diminished bases, no wheezes,  or rhonchi, normal respiratory effort. Cardiovascular system: normal S1/S2, RRR, 1+ lower extremity edema.   Gastrointestinal system: soft, NT, ND, +bowel sounds. Central nervous system:  Grossly nonfocal exam, normal speech Skin: dry, intact, normal temperature   Labs   Data Reviewed: I have personally reviewed following labs and imaging studies  CBC: Recent Labs  Lab 11/11/20 0532 11/12/20 0510 11/13/20 0517 11/14/20 0529 11/15/20 0545 11/16/20 0453 11/17/20 0611  WBC 12.1*   < > 7.7 9.5 11.6* 13.4* 13.8*  NEUTROABS 8.3*  --   --   --   --   --   --   HGB 9.2*   < > 9.3* 9.7* 10.8* 10.6* 11.2*  HCT 29.8*   < > 29.8* 31.2* 34.0* 34.5* 35.8*  MCV 90.9   < > 90.3 90.7 89.7 91.8 90.2  PLT 162   < > 195 263 352 413* 444*   < > = values in this interval not displayed.   Basic Metabolic Panel: Recent Labs  Lab 11/11/20 0532 11/12/20 0510 11/13/20 0517 11/14/20 0529 11/15/20 0545 11/17/20 0611  NA 138 139 141 138 140 141  K 3.6 3.7 3.9 3.8 3.9 4.1  CL 105 103 104 102 103 105  CO2 $Re'27 30 31 30 28 28  'eVG$ GLUCOSE 99 118* 108* 110* 117* 106*  BUN $Re'16 12 11 14 18 21  'jLC$ CREATININE 0.96 0.67 0.66 0.65 0.68 0.74  CALCIUM 8.2* 8.5* 8.8* 9.1 9.5 9.6  MG 2.1 2.2 2.2 2.0  --   --   PHOS  --  2.8 3.6 3.7  --   --    GFR: Estimated Creatinine Clearance: 82.2 mL/min (by C-G formula based on SCr of 0.74 mg/dL). Liver Function Tests: No results for input(s): AST, ALT, ALKPHOS, BILITOT, PROT, ALBUMIN in the last 168 hours. No results for input(s): LIPASE, AMYLASE in the last 168 hours. No results for input(s): AMMONIA in the last 168 hours. Coagulation  Profile: No results for input(s): INR, PROTIME in the last 168 hours. Cardiac Enzymes: No results for input(s): CKTOTAL, CKMB, CKMBINDEX, TROPONINI in the last 168 hours. BNP (last 3 results) No results for input(s): PROBNP in the last 8760 hours. HbA1C: No results for input(s): HGBA1C in the last 72 hours. CBG: No results for input(s): GLUCAP in the last 168 hours. Lipid Profile: No results for input(s): CHOL, HDL, LDLCALC, TRIG, CHOLHDL, LDLDIRECT in the last 72 hours. Thyroid Function Tests: No results for input(s): TSH, T4TOTAL, FREET4, T3FREE, THYROIDAB in the last 72 hours. Anemia Panel: No results for input(s): VITAMINB12, FOLATE, FERRITIN, TIBC, IRON, RETICCTPCT in the last 72 hours. Sepsis Labs: No results for input(s): PROCALCITON, LATICACIDVEN in the last 168 hours.  Recent Results (from the past 240 hour(s))  Culture, blood (Routine x 2)     Status: None   Collection Time: 11/09/20 12:00 PM   Specimen: BLOOD  Result Value Ref Range Status   Specimen Description BLOOD LEFT ANTECUBITAL  Final   Special Requests   Final    BOTTLES DRAWN AEROBIC AND ANAEROBIC Blood Culture adequate volume   Culture   Final    NO GROWTH 5 DAYS Performed at [facility], [address] [place], [place] [address]    Report Status 11/14/2020 FINAL  Final  Culture, blood (Routine x 2)     Status: None   Collection Time: 11/09/20 12:00 PM   Specimen: BLOOD  Result Value Ref Range Status   Specimen Description BLOOD RIGHT ANTECUBITAL  Final   Special Requests   Final    BOTTLES DRAWN AEROBIC AND ANAEROBIC Blood Culture adequate volume   Culture   Final    NO  GROWTH 5 DAYS Performed at [facility], [address] [place], [place] [address]    Report Status 11/14/2020 FINAL  Final  Urine culture     Status: Abnormal   Collection Time: 11/09/20 12:03 PM   Specimen: In/Out Cath Urine  Result Value Ref Range Status   Specimen Description   Final    IN/OUT CATH  URINE Performed at [facility], [address] [place], [place] [address]    Special Requests   Final    NONE Performed at [facility], [address] [place], [place] [address]    Culture MULTIPLE SPECIES PRESENT, SUGGEST RECOLLECTION (A)  Final   Report Status 11/11/2020 FINAL  Final  Resp Panel by RT-PCR (Flu A&B, Covid) Nasopharyngeal Swab     Status: None   Collection Time: 11/09/20 12:23 PM   Specimen: Nasopharyngeal Swab; [name]    Comment: (NOTE) SARS-CoV-2 target nucleic acids are NOT DETECTED.  The SARS-CoV-2 RNA is generally detectable in upper respiratory specimens during the acute phase of infection. The lowest concentration of SARS-CoV-2 viral copies this assay can detect is 138 copies/mL. A negative result does not preclude SARS-Cov-2 infection and should not be used as the sole basis for treatment or other patient management decisions. A negative result may occur with  improper specimen collection/handling, submission of specimen other than nasopharyngeal swab, presence of viral mutation(s) within the areas targeted by this assay, and inadequate number of viral copies(<138 copies/mL). A negative result must be combined with clinical observations, patient history, and epidemiological information. The expected result is Negative.  Fact Sheet for Patients:  EntrepreneurPulse.com.au  Fact Sheet for Healthcare Providers:  IncredibleEmployment.be  This test is no t yet approved or cleared by the [place] FDA and  has been authorized for detection and/or diagnosis of SARS-CoV-2 by FDA under an Emergency Use Authorization (EUA). This EUA will remain  in effect (meaning this test can be used) for the duration of the COVID-19 declaration under Section 564(b)(1) of the Act,  21 U.S.C.section 360bbb-3(b)(1), unless the authorization is terminated  or revoked sooner.       Influenza A by PCR NEGATIVE NEGATIVE Final   Influenza B by PCR NEGATIVE NEGATIVE Final    Comment: (NOTE) The Xpert Xpress SARS-CoV-2/FLU/RSV plus assay is intended as an aid in the diagnosis of influenza from Nasopharyngeal swab specimens and should not be used as a sole basis for treatment. Nasal washings and aspirates are unacceptable for Xpert Xpress SARS-CoV-2/FLU/RSV testing.  Fact Sheet for Patients: EntrepreneurPulse.com.au  Fact Sheet for Healthcare Providers: IncredibleEmployment.be  This test is not yet approved or cleared by the [place] FDA and has been authorized for detection and/or diagnosis of SARS-CoV-2 by FDA under an Emergency Use Authorization (EUA). This EUA will remain in effect (meaning this test can be used) for the duration of the COVID-19 declaration under Section 564(b)(1) of the Act, 21 U.S.C. section 360bbb-3(b)(1), unless the authorization is terminated or revoked.  Performed at [facility], [address] [place], [place] [address]   CULTURE, BLOOD (ROUTINE X 2) w Reflex to ID Panel     Status: None   Collection Time: 11/11/20  8:42 AM   Specimen: BLOOD LEFT HAND  Result Value Ref Range Status   Specimen Description BLOOD LEFT HAND  Final   Special Requests   Final  BOTTLES DRAWN AEROBIC AND ANAEROBIC Blood Culture results may not be optimal due to an excessive volume of blood received in culture bottles   Culture   Final    NO GROWTH 5 DAYS Performed at [facility], [address] [place], [place] [address]    Report Status 11/16/2020 FINAL  Final  CULTURE, BLOOD (ROUTINE X 2) w Reflex to ID Panel     Status: None   Collection Time: 11/11/20  8:50 AM   Specimen: BLOOD RIGHT HAND  Result Value Ref Range Status   Specimen Description BLOOD RIGHT HAND  Final   Special Requests    Final    BOTTLES DRAWN AEROBIC AND ANAEROBIC Blood Culture results may not be optimal due to an excessive volume of blood received in culture bottles   Culture   Final    NO GROWTH 5 DAYS Performed at [facility], [address] [place], [place] [address]    Report Status 11/16/2020 FINAL  Final      Imaging Studies   DG Chest Port 1 View  Result Date: 11/17/2020 CLINICAL DATA:  65 year old female with leukocytosis. EXAM: PORTABLE CHEST 1 VIEW COMPARISON:  11/09/2020 FINDINGS: Unchanged mild cardiomegaly. The cardiomediastinal silhouette is otherwise within normal limits. Both lungs are clear. The visualized skeletal structures are unremarkable. IMPRESSION: No acute cardiopulmonary process.  Unchanged mild cardiomegaly. Electronically Signed   By: Ruthann Cancer MD   On: 11/17/2020 08:41     Medications   Scheduled Meds: . amLODipine  10 mg Oral Daily  . benztropine  0.5 mg Oral BID  . cholecalciferol  2,000 Units Oral Daily  . dextromethorphan-guaiFENesin  1 tablet Oral BID  . divalproex  1,000 mg Oral QHS  . enoxaparin (LOVENOX) injection  0.5 mg/kg Subcutaneous Q24H  . fluticasone furoate-vilanterol  1 puff Inhalation Daily  . loratadine  10 mg Oral Daily  . LORazepam  1 mg Oral Daily  . losartan  100 mg Oral Daily  . multivitamin with minerals  1 tablet Oral Daily  . pantoprazole  40 mg Oral Daily  . pravastatin  40 mg Oral QHS  . risperiDONE  1 mg Oral BID  . risperidone  4 mg Oral BID  . Vitamin E  400 Units Oral Daily   Continuous Infusions:     LOS: 8 days    Time spent: 25 minutes with >50% spent at bedside and in coordination of care    Simpson, DO [facility]  11/17/2020, 4:05 PM      If 7PM-7AM, please contact night-coverage. How to contact the [facility] Attending or Consulting provider Luis Llorens Torres or covering provider during after hours [place], for this patient?    1. Check the care team in [facility] and look for a) attending/consulting  TRH provider listed and b) the [facility] team listed 2. Log into [email or web address] and use [place]'s universal password to access. If you do not have the password, please contact the hospital operator. 3. Locate the [facility] provider you are looking for under [facility] and page to a number that you can be directly reached. 4. If you still have difficulty reaching the provider, please page the [facility] (Director on Call) for the Hospitalists listed on amion for assistance.

## 2020-11-17 NOTE — TOC Progression Note (Addendum)
Transition of Care [facility]) - Progression Note    Patient Details  Name: Debra Rich MRN: [id number] Date of Birth: 28-Dec-1955  Transition of [facility]) CM/SW Contact  Beverly Sessions, RN Phone Number: 11/17/2020, 2:46 PM  Clinical Narrative:     Met with patient and sister [name] at bedside.  Patient is agreeable to SNF.   Patient states that she would like her sister to make the decision on which facility.  Per Venida Jarvis their first choice would be [facility] initiated I have reached out directly to [place] at [facility] to request review  PASRR went to level 2.  Additional clinical submitted   Update: PASRR obtained [id number]  Expected Discharge Plan: Group Home Barriers to Discharge: Continued Medical Work up  Expected Discharge Plan and Services Expected Discharge Plan: Group Home In-house Referral: NA   Post Acute Care Choice: [place] arrangements for the past 2 months: Group Home                 DME Arranged: Berta Minor DME Agency: [employer] Date DME Agency Contacted: 11/13/20 Time DME Agency Contacted: [phone] Representative spoke with at DME Agency: [place]: PT,OT [place]: [place] ([place]) Date [place]: 11/13/20 Time [place]: 1508 Representative spoke with at [place]: [place] ([place]) Interventions    Readmission Risk Interventions No flowsheet data found.

## 2020-11-17 NOTE — Progress Notes (Signed)
Mobility Specialist - Progress Note   11/17/20 1600  Mobility  Activity Ambulated in room  Level of Assistance Minimal assist, patient does 75% or more  Assistive Device Front wheel walker  Distance Ambulated (ft) 100 ft  Mobility Response Tolerated well  Mobility performed by Mobility specialist  $Mobility charge 1 Mobility    Pre-mobility: 74 HR, 95% SpO2 During mobility: 91 HR, 93% SpO2   Pt ambulated in room with RW. No LOB noted. Denied SOB. No pain. MinA. RA.    Filiberto Pinks Mobility Specialist 11/17/20, 4:34 PM

## 2020-11-17 NOTE — TOC Progression Note (Signed)
Transition of Care [facility]) - Progression Note    Patient Details  Name: Debra Rich MRN: [id number] Date of Birth: [birth year]  Transition of Care [facility]) CM/SW Contact  Chapman Fitch, RN Phone Number: 11/17/2020, 3:19 PM  Clinical Narrative:     [facility] and can accept patient tomorrow.   Patient and sister are in agreement and have accepted the bed over  Will need repeat covid test MD notified   Expected Discharge Plan: Group Home Barriers to Discharge: Continued Medical Work up  Expected Discharge Plan and Services Expected Discharge Plan: Group Home In-house Referral: NA   Post Acute Care Choice: Home Health Living arrangements for the past 2 months: Group Home                 DME Arranged: Cherre Huger DME Agency: [employer] Date DME Agency Contacted: 11/13/20 Time DME Agency Contacted: [phone] Representative spoke with at DME Agency: zach HH Arranged: PT,OT HH Agency: [facility] Date HH Agency Contacted: 11/13/20 Time HH Agency Contacted: [phone] Representative spoke with at [facility] Agency: Barbara Cower   Social Determinants of Health (SDOH) Interventions    Readmission Risk Interventions No flowsheet data found.

## 2020-11-17 NOTE — NC FL2 (Signed)
[place] MEDICAID FL2 LEVEL OF CARE SCREENING TOOL     IDENTIFICATION  Patient Name: Debra Rich Birthdate: 1956-05-17 Sex: female Admission Date (Current Location): 11/09/2020  [facility] and [place] Number:  Chiropodist and Address:  [facility], [address], [place], [place] [address]      Provider Number: [id number]  Attending Physician Name and Address:  Delfino Lovett, MD  Relative Name and Phone Number:       Current Level of Care: Hospital Recommended Level of Care: Skilled Nursing Facility Prior Approval Number:    Date Approved/Denied:   PASRR Number:    Discharge Plan: SNF    Current Diagnoses: Patient Active Problem List   Diagnosis Date Noted  . CAP (community acquired pneumonia) 11/09/2020  . Sepsis (HCC) 11/09/2020  . Acute metabolic encephalopathy 11/09/2020  . Acute respiratory failure with hypoxia (HCC) 11/09/2020  . UTI (urinary tract infection) 11/09/2020  . HTN (hypertension)   . HLD (hyperlipidemia)   . Seizure-like activity (HCC)   . GERD (gastroesophageal reflux disease)   . TBI (traumatic brain injury) (HCC)   . Bipolar 1 disorder (HCC)     Orientation RESPIRATION BLADDER Height & Weight     Self,Time,Situation,Place  Normal Continent Weight: 220 lb (99.8 kg) Height:  5\' 3"  (160 cm)  BEHAVIORAL SYMPTOMS/MOOD NEUROLOGICAL BOWEL NUTRITION STATUS      Incontinent Diet (heart healthy, thin liquids)  AMBULATORY STATUS COMMUNICATION OF NEEDS Skin   Limited Assist Verbally Normal                       Personal Care Assistance Level of Assistance  Dressing,Feeding,Bathing Bathing Assistance: Limited assistance Feeding assistance: Independent Dressing Assistance: Limited assistance     Functional Limitations Info  Sight,Hearing,Speech Sight Info: Adequate Hearing Info: Adequate Speech Info: Adequate    SPECIAL CARE FACTORS FREQUENCY  PT (By licensed PT),OT (By licensed OT)     PT Frequency:  5x OT Frequency: 5x            Contractures Contractures Info: Not present    Additional Factors Info  Code Status,Allergies Code Status Info: full code Allergies Info: penicillins           Current Medications (11/17/2020):  This is the current hospital active medication list Current Facility-Administered Medications  Medication Dose Route Frequency Provider Last Rate Last Admin  . acetaminophen (TYLENOL) suppository 650 mg  650 mg Rectal Q6H PRN 11/19/2020, MD      . acetaminophen (TYLENOL) tablet 1,000 mg  1,000 mg Oral Q6H PRN Lorretta Harp, NP   1,000 mg at 11/12/20 0934  . amLODipine (NORVASC) tablet 10 mg  10 mg Oral Daily 11/14/20 A, DO   10 mg at 11/17/20 0851  . benztropine (COGENTIN) tablet 0.5 mg  0.5 mg Oral BID 11/19/20, MD   0.5 mg at 11/16/20 2230  . cholecalciferol (VITAMIN D) tablet 2,000 Units  2,000 Units Oral Daily 2231, MD   2,000 Units at 11/17/20 [phone]  . dextromethorphan-guaiFENesin (MUCINEX DM) 30-600 MG per 12 hr tablet 1 tablet  1 tablet Oral BID 4259, MD   1 tablet at 11/17/20 [phone]  . divalproex (DEPAKOTE) DR tablet 1,000 mg  1,000 mg Oral QHS 5638, MD   1,000 mg at 11/16/20 2231  . enoxaparin (LOVENOX) injection 50 mg  0.5 mg/kg Subcutaneous Q24H 2232, MD   50 mg at 11/16/20 2234  . fluticasone furoate-vilanterol (BREO ELLIPTA)  100-25 MCG/INH 1 puff  1 puff Inhalation Daily Gillis Santa, MD   1 puff at 11/16/20 1025  . hydrALAZINE (APRESOLINE) injection 5 mg  5 mg Intravenous Q2H PRN Lorretta Harp, MD      . ipratropium-albuterol (DUONEB) 0.5-2.5 (3) MG/3ML nebulizer solution 3 mL  3 mL Nebulization Q4H PRN Esaw Grandchild A, DO      . loratadine (CLARITIN) tablet 10 mg  10 mg Oral Daily Lorretta Harp, MD   10 mg at 11/17/20 0851  . LORazepam (ATIVAN) injection 1 mg  1 mg Intravenous Q2H PRN Lorretta Harp, MD   1 mg at 11/09/20 1945  . LORazepam (ATIVAN) tablet 1 mg  1 mg Oral Daily Lorretta Harp, MD   1 mg at 11/17/20 0851   . losartan (COZAAR) tablet 100 mg  100 mg Oral Daily Esaw Grandchild A, DO   100 mg at 11/17/20 0851  . multivitamin with minerals tablet 1 tablet  1 tablet Oral Daily Lorretta Harp, MD   1 tablet at 11/17/20 [phone]  . ondansetron (ZOFRAN) injection 4 mg  4 mg Intravenous Q8H PRN Lorretta Harp, MD      . pantoprazole (PROTONIX) EC tablet 40 mg  40 mg Oral Daily Lorretta Harp, MD   40 mg at 11/17/20 5643  . pravastatin (PRAVACHOL) tablet 40 mg  40 mg Oral QHS Lorretta Harp, MD   40 mg at 11/16/20 2230  . risperiDONE (RISPERDAL) tablet 1 mg  1 mg Oral BID Lorretta Harp, MD   1 mg at 11/16/20 2231  . risperiDONE (RISPERDAL) tablet 4 mg  4 mg Oral BID Lorretta Harp, MD   4 mg at 11/16/20 2232  . Vitamin E CAPS 400 Units  400 Units Oral Daily Lorretta Harp, MD   400 Units at 11/16/20 1236     Discharge Medications: Please see discharge summary for a list of discharge medications.  Relevant Imaging Results:  Relevant Lab Results:   Additional Information SSN:[id number]  Reuel Boom Anias Bartol, LCSW

## 2020-11-17 NOTE — Progress Notes (Signed)
Physical Therapy Treatment Patient Details Name: Debra Rich MRN: [id number] DOB: 20-Oct-1955 Today's Date: 11/17/2020    History of Present Illness Pt is a 65 y/o F admitted from group home on 11/09/20 with c/c of AMS & fever. Pt being treated for sepsis 2/2 CAP & UTI. PMH: TBI, seizure like activity, HTN, bipolar disorder, HLD, GERD, anxiety    PT Comments    Patient agreeable to PT and is making progress towards meeting functional goals. Patient with increased standing activity tolerance and increased ambulation distance this session. Patient continues to require cues for safety with mobility and needs physical assistance with bed mobility and transfers. Min guard assistance provided with ambulation. Recommend to continue PT to maximize independence and facilitate return to prior level of function. SNF remains appropriate discharge plan at this time.     Follow Up Recommendations  SNF;Supervision/Assistance - 24 hour     Equipment Recommendations  Rolling walker with 5" wheels;3in1 (PT)    Recommendations for Other Services       Precautions / Restrictions Precautions Precautions: Fall Restrictions Weight Bearing Restrictions: No    Mobility  Bed Mobility Overal bed mobility: Needs Assistance Bed Mobility: Supine to Sit;Sit to Supine     Supine to sit: Min assist Sit to supine: Min guard   General bed mobility comments: increased time required to complete tasks. verbal cues sequencing. assistance for trunk support to sit upright    Transfers Overall transfer level: Needs assistance Equipment used: Rolling walker (2 wheeled) Transfers: Sit to/from Stand Sit to Stand: Min assist;Mod assist         General transfer comment: Mod A required for standing from the bed. Min A required for standing from bed side commode. patient has difficulty following commands for hand placement with transfers. multiple standing bouts performed  Ambulation/Gait Ambulation/Gait  assistance: Min guard Gait Distance (Feet): 40 Feet (performed x 2 bouts) Assistive device: Rolling walker (2 wheeled) Gait Pattern/deviations: Trunk flexed;Narrow base of support Gait velocity: decreased   General Gait Details: verbal cues for technique standing closer to walker for support. minimal assistance required for rolling walker negotiation with turns. seated rest breaks required between bouts of walking due to fatigue with standing activity   Stairs             Wheelchair Mobility    Modified Rankin (Stroke Patients Only)       Balance                                            Cognition Arousal/Alertness: Awake/alert (intermittent leathgy) Behavior During Therapy: Flat affect Overall Cognitive Status: History of cognitive impairments - at baseline (history of TBI per chart)                                 General Comments: sister at bedside reports patient seems more sleepy than usual. patient is able to follow single step commands with extra time most of the time      Exercises      General Comments        Pertinent Vitals/Pain Pain Assessment: No/denies pain    Home Living                      Prior Function  PT Goals (current goals can now be found in the care plan section) Acute Rehab PT Goals Patient Stated Goal: to go home PT Goal Formulation: With patient Time For Goal Achievement: 11/27/20 Potential to Achieve Goals: Good Progress towards PT goals: Progressing toward goals    Frequency    Min 2X/week      PT Plan Current plan remains appropriate    Co-evaluation              AM-PAC PT "6 Clicks" Mobility   Outcome Measure  Help needed turning from your back to your side while in a flat bed without using bedrails?: A Little Help needed moving from lying on your back to sitting on the side of a flat bed without using bedrails?: A Lot Help needed moving to and from a  bed to a chair (including a wheelchair)?: A Little Help needed standing up from a chair using your arms (e.g., wheelchair or bedside chair)?: A Little Help needed to walk in hospital room?: A Little Help needed climbing 3-5 steps with a railing? : A Lot 6 Click Score: 16    End of Session Equipment Utilized During Treatment: Gait belt Activity Tolerance: Patient tolerated treatment well Patient left: in bed;with call bell/phone within reach;with bed alarm set;with family/visitor present   PT Visit Diagnosis: Unsteadiness on feet (R26.81);Difficulty in walking, not elsewhere classified (R26.2);Muscle weakness (generalized) (M62.81)     Time: 1517-6160 PT Time Calculation (min) (ACUTE ONLY): 45 min  Charges:  $Gait Training: 8-22 mins $Therapeutic Activity: 23-37 mins                     Donna Bernard, PT, MPT    Ina Homes 11/17/2020, 12:59 PM

## 2020-11-18 DIAGNOSIS — I10 Essential (primary) hypertension: Secondary | ICD-10-CM | POA: Diagnosis not present

## 2020-11-18 DIAGNOSIS — J9601 Acute respiratory failure with hypoxia: Secondary | ICD-10-CM | POA: Diagnosis not present

## 2020-11-18 DIAGNOSIS — J189 Pneumonia, unspecified organism: Secondary | ICD-10-CM | POA: Diagnosis not present

## 2020-11-18 NOTE — Discharge Planning (Signed)
Patient IV removed.  DC papers printed and placed in facility packet.  Report called and s/w Linward Natal, LPN at [facility].  EMS contact to transport to [facility], rm 1004A.

## 2020-11-18 NOTE — TOC Transition Note (Addendum)
Transition of Care [facility]) - CM/SW Discharge Note   Patient Details  Name: Debra Rich MRN: [id number] Date of Birth: [birth year]  Transition of Care [facility] CM/SW Contact:  Maree Krabbe, LCSW Phone Number: 11/18/2020, 10:52 AM   Clinical Narrative:   Clinical Social Worker facilitated patient discharge including contacting patient family and facility to confirm patient discharge plans.  Clinical information faxed to facility and family agreeable with plan.  CSW arranged ambulance transport via [facility] to [facility] .  RN to call [phone] for report prior to discharge. --Pt going to room 1004 A. RN please ask to speak with [facility] nurse.       Final next level of care: Skilled Nursing Facility Barriers to Discharge: No Barriers Identified   Patient Goals and CMS Choice Patient states their goals for this hospitalization and ongoing recovery are:: to go back to group home   Choice offered to / list presented to : Patient  Discharge Placement              Patient chooses bed at: [facility] Patient to be transferred to facility by: [facility] Name of family member notified: daughter Patient and family notified of of transfer: 11/18/20  Discharge Plan and Services In-house Referral: NA   Post Acute Care Choice: Home Health          DME Arranged: Cherre Huger DME Agency: [employer] Date DME Agency Contacted: 11/13/20 Time DME Agency Contacted: [phone] Representative spoke with at DME Agency: zach HH Arranged: PT,OT HH Agency: [facility] Date HH Agency Contacted: 11/13/20 Time HH Agency Contacted: [id number] Representative spoke with at [facility] Agency: Barbara Cower  Social Determinants of Health (SDOH) Interventions     Readmission Risk Interventions No flowsheet data found.

## 2020-11-18 NOTE — Progress Notes (Signed)
Report called to Molli Hazard on 3B, patient to move to room 332 to await discharge today.

## 2020-11-18 NOTE — Discharge Summary (Signed)
Physician Discharge Summary  Debra Rich WSF:[id number] DOB: December 23, 1955 DOA: 11/09/2020  PCP: System, [name]  Admit date: 11/09/2020 Discharge date: 11/18/2020  Admitted From: group home Disposition:  SNF  Recommendations for Outpatient Follow-up:  1. Follow up with PCP in 1-2 weeks 2. Please obtain BMP/CBC in one week  Home Health: No  Equipment/Devices: none   Discharge Condition: stable  CODE STATUS: full Diet recommendation: Heart Healthy     Discharge Diagnoses: Principal Problem:   CAP (community acquired pneumonia) Active Problems:   HTN (hypertension)   HLD (hyperlipidemia)   Seizure-like activity (HCC)   GERD (gastroesophageal reflux disease)   Sepsis ([place])   Acute metabolic encephalopathy   Acute respiratory failure with hypoxia (HCC)   UTI (urinary tract infection)    Summary of HPI and Hospital Course:   65 yr old female with seizure-like activity(per Dr. Melrose Nakayama of neurology clinic note on 07/17/20), hypertension,bipolar disorderhyperlipidemia, GERD, anxiety, who presents with altered mental status and fever.  Upon arriving the emergency room, patient met sepsis criteria with leukocytosis, tachycardia, tachypnea and mild hypoxemia. Urine study was abnormal, chest x-ray showed left basilar infiltrates. Admitted and started on with cefepime and Zithromax for coverage of both UTI and pneumonia.  Sepsis due to pneumonia and UTI - Resolved. POA with leukocytosis, tachycardia, tachypnea in setting of UTI and PNA as per evaluation outlined above.  Urine culture multiple species present, suggestive recollection Blood culture NGTD, Covid negative --Completed course of Abx --Repeat CXR 3/22 obtained for leukocytosis showed no acute cardiopulmonary disease --Supportive care: incentive spirometry, antitussives  Acute metabolic encephalopathy / Hx of TBI Improved with antibiotics.  Seems at mental / cognitive baseline.  Ambulatory dysfunction /  Generalized weakness - therapy recommending SNF.  TOC following.  Hx of TBI, lives at group home & must be 100% independent to return there.   --d/c to SNF today   Acutehypoxic respiratoryfailure secondary to pneumonia -  Possible COPD as well - follow up with primary care outpatient.  Recommend PFT's. --no oxygen requirement at time of d/c --treated with ICS and PRN Duonebs  Hx of Seizure disorder - Continued on home medicines. Seizure precautions.  Outpatient follow up as previously scheduled, sooner if issues with medications.  GERD -continue Protonix  HLD -continue pravastatin   Obesity: Body mass index is 38.97 kg/m.  Complicates overall care and prognosis.  Recommend lifestyle modifications including physical activity and diet for weight loss and overall long-term health.   Discharge Instructions   Discharge Instructions    Call MD for:  extreme fatigue   Complete by: As directed    Call MD for:  persistant dizziness or light-headedness   Complete by: As directed    Call MD for:  persistant nausea and vomiting   Complete by: As directed    Call MD for:  severe uncontrolled pain   Complete by: As directed    Call MD for:  temperature >100.4   Complete by: As directed    Diet - low sodium heart healthy   Complete by: As directed    Increase activity slowly   Complete by: As directed      Allergies as of 11/18/2020      Reactions   Penicillins       Medication List    TAKE these medications   acetaminophen 500 MG tablet Commonly known as: TYLENOL Take 1,000 mg by mouth every 8 (eight) hours as needed for pain or fever.   amLODipine 10 MG tablet  Commonly known as: NORVASC Take 10 mg by mouth daily.   benztropine 0.5 MG tablet Commonly known as: COGENTIN Take 0.5 mg by mouth 2 (two) times daily.   Cholecalciferol 50 MCG (2000 UT) Caps Take 2,000 Units by mouth daily.   divalproex 500 MG DR tablet Commonly known as: DEPAKOTE Take 1,000 mg by  mouth at bedtime.   loratadine 10 MG tablet Commonly known as: CLARITIN Take 10 mg by mouth daily.   LORazepam 1 MG tablet Commonly known as: ATIVAN Take 1 mg by mouth daily.   losartan 100 MG tablet Commonly known as: COZAAR Take 100 mg by mouth daily.   omeprazole 20 MG capsule Commonly known as: PRILOSEC Take 20 mg by mouth daily.   pravastatin 40 MG tablet Commonly known as: PRAVACHOL Take 40 mg by mouth daily.   risperiDONE 1 MG tablet Commonly known as: RISPERDAL Take 1 mg by mouth 2 (two) times daily. (take with 24m tablet to equal 517mtwice daily)   risperidone 4 MG tablet Commonly known as: RISPERDAL Take 4 mg by mouth 2 (two) times daily. (take with 35m73mablet to equal 5mg56mice daily)   Therems-M Tabs Take 1 tablet by mouth daily.   vitamin E 180 MG (400 UNITS) capsule Take 400 Units by mouth daily.            Durable Medical Equipment  (From admission, onward)         Start     Ordered   11/13/20 1521  For home use only DME 3 n 1  Once        11/13/20 1520   11/13/20 1520  For home use only DME Walker rolling  Once       Question Answer Comment  Walker: With 5 Inch Wheels   Patient needs a walker to treat with the following condition SOB (shortness of breath)      11/13/20 1520          Contact information for after-discharge care    Destination    [facility] .   Service: Skilled Nursing Contact information: [phone]             Allergies  Allergen Reactions  . Penicillins      If you experience worsening of your admission symptoms, develop shortness of breath, life threatening emergency, suicidal or homicidal thoughts you must seek medical attention immediately by calling 911 or calling your MD immediately  if symptoms less severe.    Please note   You were cared for by a hospitalist during your hospital stay. If you have any questions  about your discharge medications or the care you received while you were in the hospital after you are discharged, you can call the unit and asked to speak with the hospitalist on call if the hospitalist that took care of you is not available. Once you are discharged, your primary care physician will handle any further medical issues. Please note that NO REFILLS for any discharge medications will be authorized once you are discharged, as it is imperative that you return to your primary care physician (or establish a relationship with a primary care physician if you do not have one) for your aftercare needs so that they can reassess your need for medications and monitor your lab values.   Consultations:  None    Procedures/Studies: DG Chest Port 1 View  Result Date: 11/17/2020  CLINICAL DATA:  65 year old female with leukocytosis. EXAM: PORTABLE CHEST 1 VIEW COMPARISON:  11/09/2020 FINDINGS: Unchanged mild cardiomegaly. The cardiomediastinal silhouette is otherwise within normal limits. Both lungs are clear. The visualized skeletal structures are unremarkable. IMPRESSION: No acute cardiopulmonary process.  Unchanged mild cardiomegaly. Electronically Signed   By: Ruthann Cancer MD   On: 11/17/2020 08:41   DG Chest Port 1 View  Result Date: 11/09/2020 CLINICAL DATA:  Suspected sepsis EXAM: PORTABLE CHEST 1 VIEW COMPARISON:  None. FINDINGS: Mild cardiomegaly. Suspect heterogeneous airspace opacity at the left lung base with loss of diaphragmatic distinction. The visualized skeletal structures are unremarkable. IMPRESSION: 1. Suspect heterogeneous airspace opacity at the left lung base, concerning for infection. PA and lateral radiographs may be helpful to further evaluate. 2. Mild cardiomegaly. Electronically Signed   By: Eddie Candle M.D.   On: 11/09/2020 12:23       Subjective: Pt seen this AM at bedside.  Feels well.  Denies any complaints including fever/chills, cough, SOB, N/V/D.     Discharge  Exam: Vitals:   11/18/20 0753 11/18/20 0831  BP: 120/74 (!) 152/67  Pulse: 91 82  Resp: 15 17  Temp: 97.7 F (36.5 C) 97.8 F (36.6 C)  SpO2: 95% 97%   Vitals:   11/18/20 0453 11/18/20 0746 11/18/20 0753 11/18/20 0831  BP: (!) 108/55 122/68 120/74 (!) 152/67  Pulse: 76 75 91 82  Resp: _0 Temp: 98.3 F (36.8 C) 97.7 F (36.5 C) 97.7 F (36.5 C) 97.8 F (36.6 C)  TempSrc: Oral Oral  Oral  SpO2: 92% 95% 95% 97%  Weight:      Height:        General: Pt is alert, awake, not in acute distress, obese Cardiovascular: RRR, S1/S2 +, no rubs, no gallops Respiratory: CTA bilaterally, no wheezing, no rhonchi Abdominal: Soft, NT, ND, bowel sounds + Extremities: no edema, no cyanosis    The results of significant diagnostics from this hospitalization (including imaging, microbiology, ancillary and laboratory) are listed below for reference.     Microbiology: Recent Results (from the past 240 hour(s))  Culture, blood (Routine x 2)     Status: None   Collection Time: 11/09/20 12:00 PM   Specimen: BLOOD  Result Value Ref Range Status   Specimen Description BLOOD LEFT ANTECUBITAL  Final   Special Requests   Final    BOTTLES DRAWN AEROBIC AND ANAEROBIC Blood Culture adequate volume   Culture   Final    NO GROWTH 5 DAYS Performed at [facility], [address] [place], [place] [address]    Report Status 11/14/2020 FINAL  Final  Culture, blood (Routine x 2)     Status: None   Collection Time: 11/09/20 12:00 PM   Specimen: BLOOD  Result Value Ref Range Status   Specimen Description BLOOD RIGHT ANTECUBITAL  Final   Special Requests   Final    BOTTLES DRAWN AEROBIC AND ANAEROBIC Blood Culture adequate volume   Culture   Final    NO GROWTH 5 DAYS Performed at [facility], [address] [place], [place] [address]    Report Status 11/14/2020 FINAL  Final  Urine culture     Status: Abnormal   Collection Time: 11/09/20 12:03 PM   Specimen: In/Out  Cath Urine  Result Value Ref Range Status   Specimen Description   Final    IN/OUT CATH URINE Performed at [facility], [address], [place], [place] [address]  Special Requests   Final    NONE Performed at [facility], [address] [place], [place] [address]    Culture MULTIPLE SPECIES PRESENT, SUGGEST RECOLLECTION (A)  Final   Report Status 11/11/2020 FINAL  Final  Resp Panel by RT-PCR (Flu A&B, Covid) Nasopharyngeal Swab     Status: None   Collection Time: 11/09/20 12:23 PM   Specimen: Nasopharyngeal Swab; [name]    Comment: (NOTE) SARS-CoV-2 target nucleic acids are NOT DETECTED.  The SARS-CoV-2 RNA is generally detectable in upper respiratory specimens during the acute phase of infection. The lowest concentration of SARS-CoV-2 viral copies this assay can detect is 138 copies/mL. A negative result does not preclude SARS-Cov-2 infection and should not be used as the sole basis for treatment or other patient management decisions. A negative result may occur with  improper specimen collection/handling, submission of specimen other than nasopharyngeal swab, presence of viral mutation(s) within the areas targeted by this assay, and inadequate number of viral copies(<138 copies/mL). A negative result must be combined with clinical observations, patient history, and epidemiological information. The expected result is Negative.  Fact Sheet for Patients:  EntrepreneurPulse.com.au  Fact Sheet for Healthcare Providers:  IncredibleEmployment.be  This test is no t yet approved or cleared by the [place] FDA and  has been authorized for detection and/or diagnosis of SARS-CoV-2 by FDA under an Emergency Use Authorization (EUA). This EUA will remain  in effect (meaning this test can be used)  for the duration of the COVID-19 declaration under Section 564(b)(1) of the Act, 21 U.S.C.section 360bbb-3(b)(1), unless the authorization is terminated  or revoked sooner.       Influenza A by PCR NEGATIVE NEGATIVE Final   Influenza B by PCR NEGATIVE NEGATIVE Final    Comment: (NOTE) The Xpert Xpress SARS-CoV-2/FLU/RSV plus assay is intended as an aid in the diagnosis of influenza from Nasopharyngeal swab specimens and should not be used as a sole basis for treatment. Nasal washings and aspirates are unacceptable for Xpert Xpress SARS-CoV-2/FLU/RSV testing.  Fact Sheet for Patients: EntrepreneurPulse.com.au  Fact Sheet for Healthcare Providers: IncredibleEmployment.be  This test is not yet approved or cleared by the [place] FDA and has been authorized for detection and/or diagnosis of SARS-CoV-2 by FDA under an Emergency Use Authorization (EUA). This EUA will remain in effect (meaning this test can be used) for the duration of the COVID-19 declaration under Section 564(b)(1) of the Act, 21 U.S.C. section 360bbb-3(b)(1), unless the authorization is terminated or revoked.  Performed at [facility], [address] [place], [place] [address]   CULTURE, BLOOD (ROUTINE X 2) w Reflex to ID Panel     Status: None   Collection Time: 11/11/20  8:42 AM   Specimen: BLOOD LEFT HAND  Result Value Ref Range Status   Specimen Description BLOOD LEFT HAND  Final   Special Requests   Final    BOTTLES DRAWN AEROBIC AND ANAEROBIC Blood Culture results may not be optimal due to an excessive volume of blood received in culture bottles   Culture   Final    NO GROWTH 5 DAYS Performed at [facility], [address] [place], [place] [address]    Report Status 11/16/2020 FINAL  Final  CULTURE, BLOOD (ROUTINE X 2) w Reflex to ID Panel     Status: None   Collection Time: 11/11/20  8:50 AM   Specimen: BLOOD RIGHT HAND  Result Value  Ref Range Status   Specimen Description BLOOD RIGHT HAND  Final   Special Requests   Final    BOTTLES DRAWN AEROBIC AND ANAEROBIC Blood Culture results may not be optimal due to an excessive volume of blood received in culture bottles   Culture   Final    NO GROWTH 5 DAYS Performed at [facility], [address] [place], [place] [address]    Report Status 11/16/2020 FINAL  Final  Resp Panel by RT-PCR (Flu A&B, Covid) Nasopharyngeal Swab     Status: None   Collection Time: 11/17/20  3:23 PM   Specimen: Nasopharyngeal Swab; [name]    Comment: (NOTE) SARS-CoV-2 target nucleic acids are NOT DETECTED.  The SARS-CoV-2 RNA is generally detectable in upper respiratory specimens during the acute phase of infection. The lowest concentration of SARS-CoV-2 viral copies this assay can detect is 138 copies/mL. A negative result does not preclude SARS-Cov-2 infection and should not be used as the sole basis for treatment or other patient management decisions. A negative result may occur with  improper specimen collection/handling, submission of specimen other than nasopharyngeal swab, presence of viral mutation(s) within the areas targeted by this assay, and inadequate number of viral copies(<138 copies/mL). A negative result must be combined with clinical observations, patient history, and epidemiological information. The expected result is Negative.  Fact Sheet for Patients:  EntrepreneurPulse.com.au  Fact Sheet for Healthcare Providers:  IncredibleEmployment.be  This test is no t yet approved or cleared by the [place] FDA and  has been authorized for detection and/or diagnosis of SARS-CoV-2 by FDA under an Emergency Use Authorization (EUA). This EUA will remain  in effect (meaning this test can be used) for the  duration of the COVID-19 declaration under Section 564(b)(1) of the Act, 21 U.S.C.section 360bbb-3(b)(1), unless the authorization is terminated  or revoked sooner.       Influenza A by PCR NEGATIVE NEGATIVE Final   Influenza B by PCR NEGATIVE NEGATIVE Final    Comment: (NOTE) The Xpert Xpress SARS-CoV-2/FLU/RSV plus assay is intended as an aid in the diagnosis of influenza from Nasopharyngeal swab specimens and should not be used as a sole basis for treatment. Nasal washings and aspirates are unacceptable for Xpert Xpress SARS-CoV-2/FLU/RSV testing.  Fact Sheet for Patients: EntrepreneurPulse.com.au  Fact Sheet for Healthcare Providers: IncredibleEmployment.be  This test is not yet approved or cleared by the [place] FDA and has been authorized for detection and/or diagnosis of SARS-CoV-2 by FDA under an Emergency Use Authorization (EUA). This EUA will remain in effect (meaning this test can be used) for the duration of the COVID-19 declaration under Section 564(b)(1) of the Act, 21 U.S.C. section 360bbb-3(b)(1), unless the authorization is terminated or revoked.  Performed at [facility], [address] [place], [place] [address]      Labs: BNP (last 3 results) Recent Labs    11/09/20 1200  BNP 600.4*   Basic Metabolic Panel: Recent Labs  Lab 11/12/20 0510 11/13/20 0517 11/14/20 0529 11/15/20 0545 11/17/20 0611  NA 139 141 138 140 141  K 3.7 3.9 3.8 3.9 4.1  CL 103 104 102 103 105  CO2 _0 GLUCOSE 118* 108* 110* 117* 106*  BUN _1 CREATININE 0.67 0.66  0.65 0.68 0.74  CALCIUM 8.5* 8.8* 9.1 9.5 9.6  MG 2.2 2.2 2.0  --   --   PHOS 2.8 3.6 3.7  --   --    Liver Function Tests: No results for input(s): AST, ALT, ALKPHOS, BILITOT, PROT, ALBUMIN in the last 168 hours. No results for input(s): LIPASE, AMYLASE in the last 168 hours. No results for input(s): AMMONIA in the last 168  hours. CBC: Recent Labs  Lab 11/13/20 0517 11/14/20 0529 11/15/20 0545 11/16/20 0453 11/17/20 0611  WBC 7.7 9.5 11.6* 13.4* 13.8*  HGB 9.3* 9.7* 10.8* 10.6* 11.2*  HCT 29.8* 31.2* 34.0* 34.5* 35.8*  MCV 90.3 90.7 89.7 91.8 90.2  PLT 195 263 352 413* 444*   Cardiac Enzymes: No results for input(s): CKTOTAL, CKMB, CKMBINDEX, TROPONINI in the last 168 hours. BNP: Invalid input(s): POCBNP CBG: No results for input(s): GLUCAP in the last 168 hours. D-Dimer No results for input(s): DDIMER in the last 72 hours. Hgb A1c No results for input(s): HGBA1C in the last 72 hours. Lipid Profile No results for input(s): CHOL, HDL, LDLCALC, TRIG, CHOLHDL, LDLDIRECT in the last 72 hours. Thyroid function studies No results for input(s): TSH, T4TOTAL, T3FREE, THYROIDAB in the last 72 hours.  Invalid input(s): FREET3 Anemia work up No results for input(s): VITAMINB12, FOLATE, FERRITIN, TIBC, IRON, RETICCTPCT in the last 72 hours. Urinalysis    Component Value Date/Time   COLORURINE YELLOW (A) 11/09/2020 1159   APPEARANCEUR CLOUDY (A) 11/09/2020 1159   LABSPEC 1.015 11/09/2020 1159   PHURINE 5.0 11/09/2020 1159   GLUCOSEU NEGATIVE 11/09/2020 1159   HGBUR MODERATE (A) 11/09/2020 1159   BILIRUBINUR NEGATIVE 11/09/2020 1159   KETONESUR 5 (A) 11/09/2020 1159   PROTEINUR 100 (A) 11/09/2020 1159   NITRITE NEGATIVE 11/09/2020 1159   LEUKOCYTESUR MODERATE (A) 11/09/2020 1159   Sepsis Labs Invalid input(s): PROCALCITONIN,  WBC,  LACTICIDVEN Microbiology Recent Results (from the past 240 hour(s))  Culture, blood (Routine x 2)     Status: None   Collection Time: 11/09/20 12:00 PM   Specimen: BLOOD  Result Value Ref Range Status   Specimen Description BLOOD LEFT ANTECUBITAL  Final   Special Requests   Final    BOTTLES DRAWN AEROBIC AND ANAEROBIC Blood Culture adequate volume   Culture   Final    NO GROWTH 5 DAYS Performed at [facility], [address], [place], [place]  [address]    Report Status 11/14/2020 FINAL  Final  Culture, blood (Routine x 2)     Status: None   Collection Time: 11/09/20 12:00 PM   Specimen: BLOOD  Result Value Ref Range Status   Specimen Description BLOOD RIGHT ANTECUBITAL  Final   Special Requests   Final    BOTTLES DRAWN AEROBIC AND ANAEROBIC Blood Culture adequate volume   Culture   Final    NO GROWTH 5 DAYS Performed at [facility], [address] [place], [place] [address]    Report Status 11/14/2020 FINAL  Final  Urine culture     Status: Abnormal   Collection Time: 11/09/20 12:03 PM   Specimen: In/Out Cath Urine  Result Value Ref Range Status   Specimen Description   Final    IN/OUT CATH URINE Performed at [facility], [address], [place], [place] [address]    Special Requests   Final    NONE Performed at [facility], [address] [place],  [address]    Culture MULTIPLE SPECIES PRESENT, SUGGEST  RECOLLECTION (A)  Final   Report Status 11/11/2020 FINAL  Final  Resp Panel by RT-PCR (Flu A&B, Covid) Nasopharyngeal Swab     Status: None   Collection Time: 11/09/20 12:23 PM   Specimen: Nasopharyngeal Swab; [name]    Comment: (NOTE) SARS-CoV-2 target nucleic acids are NOT DETECTED.  The SARS-CoV-2 RNA is generally detectable in upper respiratory specimens during the acute phase of infection. The lowest concentration of SARS-CoV-2 viral copies this assay can detect is 138 copies/mL. A negative result does not preclude SARS-Cov-2 infection and should not be used as the sole basis for treatment or other patient management decisions. A negative result may occur with  improper specimen collection/handling, submission of specimen other than nasopharyngeal swab, presence of viral mutation(s) within the areas targeted by this assay, and inadequate  number of viral copies(<138 copies/mL). A negative result must be combined with clinical observations, patient history, and epidemiological information. The expected result is Negative.  Fact Sheet for Patients:  EntrepreneurPulse.com.au  Fact Sheet for Healthcare Providers:  IncredibleEmployment.be  This test is no t yet approved or cleared by the [place] FDA and  has been authorized for detection and/or diagnosis of SARS-CoV-2 by FDA under an Emergency Use Authorization (EUA). This EUA will remain  in effect (meaning this test can be used) for the duration of the COVID-19 declaration under Section 564(b)(1) of the Act, 21 U.S.C.section 360bbb-3(b)(1), unless the authorization is terminated  or revoked sooner.       Influenza A by PCR NEGATIVE NEGATIVE Final   Influenza B by PCR NEGATIVE NEGATIVE Final    Comment: (NOTE) The Xpert Xpress SARS-CoV-2/FLU/RSV plus assay is intended as an aid in the diagnosis of influenza from Nasopharyngeal swab specimens and should not be used as a sole basis for treatment. Nasal washings and aspirates are unacceptable for Xpert Xpress SARS-CoV-2/FLU/RSV testing.  Fact Sheet for Patients: EntrepreneurPulse.com.au  Fact Sheet for Healthcare Providers: IncredibleEmployment.be  This test is not yet approved or cleared by the [place] FDA and has been authorized for detection and/or diagnosis of SARS-CoV-2 by FDA under an Emergency Use Authorization (EUA). This EUA will remain in effect (meaning this test can be used) for the duration of the COVID-19 declaration under Section 564(b)(1) of the Act, 21 U.S.C. section 360bbb-3(b)(1), unless the authorization is terminated or revoked.  Performed at [facility], [address] [place], [place] [address]   CULTURE, BLOOD (ROUTINE X 2) w Reflex to ID Panel     Status: None   Collection Time: 11/11/20  8:42  AM   Specimen: BLOOD LEFT HAND  Result Value Ref Range Status   Specimen Description BLOOD LEFT HAND  Final   Special Requests   Final    BOTTLES DRAWN AEROBIC AND ANAEROBIC Blood Culture results may not be optimal due to an excessive volume of blood received in culture bottles   Culture   Final    NO GROWTH 5 DAYS Performed at [facility], [address] [place], [place] [address]    Report Status 11/16/2020 FINAL  Final  CULTURE, BLOOD (ROUTINE X 2) w Reflex to ID Panel     Status: None   Collection Time: 11/11/20  8:50 AM   Specimen: BLOOD RIGHT HAND  Result Value Ref Range Status   Specimen Description BLOOD RIGHT HAND  Final   Special Requests  Final    BOTTLES DRAWN AEROBIC AND ANAEROBIC Blood Culture results may not be optimal due to an excessive volume of blood received in culture bottles   Culture   Final    NO GROWTH 5 DAYS Performed at [facility], [address] [place], [place] [address]    Report Status 11/16/2020 FINAL  Final  Resp Panel by RT-PCR (Flu A&B, Covid) Nasopharyngeal Swab     Status: None   Collection Time: 11/17/20  3:23 PM   Specimen: Nasopharyngeal Swab; [name]    Comment: (NOTE) SARS-CoV-2 target nucleic acids are NOT DETECTED.  The SARS-CoV-2 RNA is generally detectable in upper respiratory specimens during the acute phase of infection. The lowest concentration of SARS-CoV-2 viral copies this assay can detect is 138 copies/mL. A negative result does not preclude SARS-Cov-2 infection and should not be used as the sole basis for treatment or other patient management decisions. A negative result may occur with  improper specimen collection/handling, submission of specimen other than nasopharyngeal swab, presence of viral mutation(s) within the areas targeted by this assay, and inadequate number of  viral copies(<138 copies/mL). A negative result must be combined with clinical observations, patient history, and epidemiological information. The expected result is Negative.  Fact Sheet for Patients:  EntrepreneurPulse.com.au  Fact Sheet for Healthcare Providers:  IncredibleEmployment.be  This test is no t yet approved or cleared by the [place] FDA and  has been authorized for detection and/or diagnosis of SARS-CoV-2 by FDA under an Emergency Use Authorization (EUA). This EUA will remain  in effect (meaning this test can be used) for the duration of the COVID-19 declaration under Section 564(b)(1) of the Act, 21 U.S.C.section 360bbb-3(b)(1), unless the authorization is terminated  or revoked sooner.       Influenza A by PCR NEGATIVE NEGATIVE Final   Influenza B by PCR NEGATIVE NEGATIVE Final    Comment: (NOTE) The Xpert Xpress SARS-CoV-2/FLU/RSV plus assay is intended as an aid in the diagnosis of influenza from Nasopharyngeal swab specimens and should not be used as a sole basis for treatment. Nasal washings and aspirates are unacceptable for Xpert Xpress SARS-CoV-2/FLU/RSV testing.  Fact Sheet for Patients: EntrepreneurPulse.com.au  Fact Sheet for Healthcare Providers: IncredibleEmployment.be  This test is not yet approved or cleared by the [place] FDA and has been authorized for detection and/or diagnosis of SARS-CoV-2 by FDA under an Emergency Use Authorization (EUA). This EUA will remain in effect (meaning this test can be used) for the duration of the COVID-19 declaration under Section 564(b)(1) of the Act, 21 U.S.C. section 360bbb-3(b)(1), unless the authorization is terminated or revoked.  Performed at [facility], [address] [place], [place] [address]      Time coordinating discharge: Over 30 minutes  SIGNED:   Ezekiel Slocumb, DO [facility] 11/18/2020, 10:25 AM   If 7PM-7AM, please contact night-coverage [email or web address]

## 2021-07-24 ENCOUNTER — Emergency Department: Payer: Medicare Other

## 2021-07-24 ENCOUNTER — Inpatient Hospital Stay
Admission: EM | Admit: 2021-07-24 | Discharge: 2021-07-27 | DRG: 871 | Disposition: A | Discharge status: Home / Self Care | Payer: Medicare Other | Attending: Internal Medicine | Admitting: Internal Medicine

## 2021-07-24 ENCOUNTER — Encounter: Payer: Self-pay | Admitting: Emergency Medicine

## 2021-07-24 ENCOUNTER — Other Ambulatory Visit: Payer: Self-pay

## 2021-07-24 DIAGNOSIS — A4189 Other specified sepsis: Principal | ICD-10-CM | POA: Diagnosis present

## 2021-07-24 DIAGNOSIS — J9601 Acute respiratory failure with hypoxia: Secondary | ICD-10-CM | POA: Diagnosis present

## 2021-07-24 DIAGNOSIS — N390 Urinary tract infection, site not specified: Secondary | ICD-10-CM | POA: Diagnosis present

## 2021-07-24 DIAGNOSIS — N179 Acute kidney failure, unspecified: Secondary | ICD-10-CM | POA: Diagnosis present

## 2021-07-24 DIAGNOSIS — N3001 Acute cystitis with hematuria: Secondary | ICD-10-CM | POA: Diagnosis present

## 2021-07-24 DIAGNOSIS — Z880 Allergy status to penicillin: Secondary | ICD-10-CM

## 2021-07-24 DIAGNOSIS — A4159 Other Gram-negative sepsis: Secondary | ICD-10-CM | POA: Diagnosis present

## 2021-07-24 DIAGNOSIS — E785 Hyperlipidemia, unspecified: Secondary | ICD-10-CM | POA: Diagnosis present

## 2021-07-24 DIAGNOSIS — Z6838 Body mass index (BMI) 38.0-38.9, adult: Secondary | ICD-10-CM

## 2021-07-24 DIAGNOSIS — Z87820 Personal history of traumatic brain injury: Secondary | ICD-10-CM

## 2021-07-24 DIAGNOSIS — I10 Essential (primary) hypertension: Secondary | ICD-10-CM | POA: Diagnosis present

## 2021-07-24 DIAGNOSIS — J101 Influenza due to other identified influenza virus with other respiratory manifestations: Secondary | ICD-10-CM | POA: Diagnosis present

## 2021-07-24 DIAGNOSIS — A419 Sepsis, unspecified organism: Secondary | ICD-10-CM

## 2021-07-24 DIAGNOSIS — Z20822 Contact with and (suspected) exposure to covid-19: Secondary | ICD-10-CM | POA: Diagnosis present

## 2021-07-24 DIAGNOSIS — E669 Obesity, unspecified: Secondary | ICD-10-CM | POA: Diagnosis present

## 2021-07-24 DIAGNOSIS — F319 Bipolar disorder, unspecified: Secondary | ICD-10-CM | POA: Diagnosis present

## 2021-07-24 DIAGNOSIS — G9341 Metabolic encephalopathy: Secondary | ICD-10-CM | POA: Diagnosis present

## 2021-07-24 DIAGNOSIS — R6520 Severe sepsis without septic shock: Secondary | ICD-10-CM

## 2021-07-24 DIAGNOSIS — Z90710 Acquired absence of both cervix and uterus: Secondary | ICD-10-CM

## 2021-07-24 DIAGNOSIS — K219 Gastro-esophageal reflux disease without esophagitis: Secondary | ICD-10-CM | POA: Diagnosis present

## 2021-07-24 DIAGNOSIS — Z8249 Family history of ischemic heart disease and other diseases of the circulatory system: Secondary | ICD-10-CM

## 2021-07-24 DIAGNOSIS — I472 Ventricular tachycardia, unspecified: Secondary | ICD-10-CM | POA: Diagnosis not present

## 2021-07-24 DIAGNOSIS — Z79899 Other long term (current) drug therapy: Secondary | ICD-10-CM

## 2021-07-24 DIAGNOSIS — I471 Supraventricular tachycardia: Secondary | ICD-10-CM | POA: Diagnosis not present

## 2021-07-24 DIAGNOSIS — Z87891 Personal history of nicotine dependence: Secondary | ICD-10-CM

## 2021-07-24 DIAGNOSIS — S069XAA Unspecified intracranial injury with loss of consciousness status unknown, initial encounter: Secondary | ICD-10-CM | POA: Diagnosis present

## 2021-07-24 DIAGNOSIS — F419 Anxiety disorder, unspecified: Secondary | ICD-10-CM | POA: Diagnosis present

## 2021-07-24 LAB — COMPREHENSIVE METABOLIC PANEL
ALT: 37 U/L (ref 0–44)
AST: 95 U/L — ABNORMAL HIGH (ref 15–41)
Albumin: 3.4 g/dL — ABNORMAL LOW (ref 3.5–5.0)
Alkaline Phosphatase: 50 U/L (ref 38–126)
Anion gap: 8 (ref 5–15)
BUN: 29 mg/dL — ABNORMAL HIGH (ref 8–23)
CO2: 24 mmol/L (ref 22–32)
Calcium: 8.7 mg/dL — ABNORMAL LOW (ref 8.9–10.3)
Chloride: 101 mmol/L (ref 98–111)
Creatinine, Ser: 1.34 mg/dL — ABNORMAL HIGH (ref 0.44–1.00)
GFR, Estimated: 45 mL/min — ABNORMAL LOW (ref >60)
Glucose, Bld: 120 mg/dL — ABNORMAL HIGH (ref 70–99)
Potassium: 3.8 mmol/L (ref 3.5–5.1)
Sodium: 133 mmol/L — ABNORMAL LOW (ref 135–145)
Total Bilirubin: 0.7 mg/dL (ref 0.3–1.2)
Total Protein: 6.7 g/dL (ref 6.5–8.1)

## 2021-07-24 LAB — RESP PANEL BY RT-PCR (FLU A&B, COVID) ARPGX2
Influenza A by PCR: POSITIVE — AB
Influenza B by PCR: NEGATIVE
SARS Coronavirus 2 by RT PCR: NEGATIVE

## 2021-07-24 LAB — CBC WITH DIFFERENTIAL/PLATELET
Abs Immature Granulocytes: 0.13 10*3/uL — ABNORMAL HIGH (ref 0.00–0.07)
Basophils Absolute: 0 10*3/uL (ref 0.0–0.1)
Basophils Relative: 0 %
Eosinophils Absolute: 0 10*3/uL (ref 0.0–0.5)
Eosinophils Relative: 0 %
HCT: 36.4 % (ref 36.0–46.0)
Hemoglobin: 12 g/dL (ref 12.0–15.0)
Immature Granulocytes: 2 %
Lymphocytes Relative: 18 %
Lymphs Abs: 1.4 10*3/uL (ref 0.7–4.0)
MCH: 29.6 pg (ref 26.0–34.0)
MCHC: 33 g/dL (ref 30.0–36.0)
MCV: 89.9 fL (ref 80.0–100.0)
Monocytes Absolute: 1.7 10*3/uL — ABNORMAL HIGH (ref 0.1–1.0)
Monocytes Relative: 22 %
Neutro Abs: 4.4 10*3/uL (ref 1.7–7.7)
Neutrophils Relative %: 58 %
Platelets: 234 10*3/uL (ref 150–400)
RBC: 4.05 MIL/uL (ref 3.87–5.11)
RDW: 13.7 % (ref 11.5–15.5)
WBC: 7.6 10*3/uL (ref 4.0–10.5)
nRBC: 0 % (ref 0.0–0.2)

## 2021-07-24 LAB — LACTIC ACID, PLASMA
Lactic Acid, Venous: 2.3 mmol/L (ref 0.5–1.9)
Lactic Acid, Venous: 1.1 mmol/L (ref 0.5–1.9)

## 2021-07-24 LAB — PROTIME-INR
INR: 1.1 (ref 0.8–1.2)
Prothrombin Time: 13.9 seconds (ref 11.4–15.2)

## 2021-07-24 MED ORDER — LACTATED RINGERS IV SOLN: INTRAVENOUS | Status: AC

## 2021-07-24 MED ORDER — LACTATED RINGERS IV BOLUS
1000.0000 mL | Freq: Once | INTRAVENOUS | Status: AC
  Administered 2021-07-24: 1000 mL via INTRAVENOUS

## 2021-07-24 MED ORDER — LACTATED RINGERS IV BOLUS (SEPSIS)
1000.0000 mL | Freq: Once | INTRAVENOUS | Status: AC
  Administered 2021-07-25: 1000 mL via INTRAVENOUS

## 2021-07-24 MED ORDER — LACTATED RINGERS IV BOLUS (SEPSIS)
600.0000 mL | Freq: Once | INTRAVENOUS | Status: AC
  Administered 2021-07-25: 02:00:00 600 mL via INTRAVENOUS

## 2021-07-24 MED ORDER — OSELTAMIVIR PHOSPHATE 75 MG PO CAPS
75.0000 mg | ORAL_CAPSULE | Freq: Once | ORAL | Status: AC
  Administered 2021-07-25: 02:00:00 75 mg via ORAL
  Filled 2021-07-24: qty 1

## 2021-07-24 MED ORDER — ACETAMINOPHEN 500 MG PO TABS
1000.0000 mg | ORAL_TABLET | Freq: Once | ORAL | Status: AC
  Administered 2021-07-25: 1000 mg via ORAL
  Filled 2021-07-24: qty 2

## 2021-07-24 NOTE — ED Notes (Signed)
Dr. Lenard Lance aware pt is possible sepsis pt.

## 2021-07-24 NOTE — ED Provider Notes (Signed)
[facility] Emergency Department Provider Note  ____________________________________________  Time seen: Approximately 11:51 PM  I have reviewed the triage vital signs and the nursing notes.   HISTORY  Chief Complaint Altered Mental Status   HPI Debra Rich is a 65 y.o. female with a history of bipolar disorder, hypertension, hyperlipidemia, TBI who presents from her group home for altered mental status.  According to the patient she has had 3 days of body aches, chills, cough, shortness of breath, fever.  Has had polyuria but denies dysuria.  No nausea, no vomiting, no chest pain, no abdominal pain, no diarrhea.   Past Medical History:  Diagnosis Date   Bipolar 1 disorder ([place])    GERD (gastroesophageal reflux disease)    HLD (hyperlipidemia)    HTN (hypertension)    TBI (traumatic brain injury)     Patient Active Problem List   Diagnosis Date Noted   CAP (community acquired pneumonia) 11/09/2020   Sepsis ([place]) 123456   Acute metabolic encephalopathy 123456   Acute respiratory failure with hypoxia ([place]) 11/09/2020   UTI (urinary tract infection) 11/09/2020   HTN (hypertension)    HLD (hyperlipidemia)    Seizure-like activity ([facility])    GERD (gastroesophageal reflux disease)    TBI (traumatic brain injury)    Bipolar 1 disorder ([place])     Past Surgical History:  Procedure Laterality Date   ABDOMINAL HYSTERECTOMY     pt reported, but per group home manager, this may not be accurate.    Prior to Admission medications   Medication Sig Start Date End Date Taking? Authorizing Provider  acetaminophen (TYLENOL) 500 MG tablet Take 1,000 mg by mouth every 8 (eight) hours as needed for pain or fever.    [provider]  amLODipine (NORVASC) 10 MG tablet Take 10 mg by mouth daily.    [provider]  benztropine (COGENTIN) 0.5 MG tablet Take 0.5 mg by mouth 2 (two) times daily.    [provider]  Cholecalciferol 50  MCG (2000 UT) CAPS Take 2,000 Units by mouth daily.    [provider]  divalproex (DEPAKOTE) 500 MG DR tablet Take 1,000 mg by mouth at bedtime.    [provider]  loratadine (CLARITIN) 10 MG tablet Take 10 mg by mouth daily.    [provider]  LORazepam (ATIVAN) 1 MG tablet Take 1 mg by mouth daily.    [provider]  losartan (COZAAR) 100 MG tablet Take 100 mg by mouth daily.    [provider]  Multiple Vitamins-Minerals (THEREMS-M) TABS Take 1 tablet by mouth daily.    [provider]  omeprazole (PRILOSEC) 20 MG capsule Take 20 mg by mouth daily.    [provider]  pravastatin (PRAVACHOL) 40 MG tablet Take 40 mg by mouth daily.    [provider]  risperiDONE (RISPERDAL) 1 MG tablet Take 1 mg by mouth 2 (two) times daily. (take with 4mg  tablet to equal 5mg  twice daily)    [provider]  risperidone (RISPERDAL) 4 MG tablet Take 4 mg by mouth 2 (two) times daily. (take with 1mg  tablet to equal 5mg  twice daily)    [provider]  vitamin E 180 MG (400 UNITS) capsule Take 400 Units by mouth daily.    [provider]    Allergies Penicillins  Family History  Problem Relation Age of Onset   Diabetes Mellitus II Mother    Hypertension Mother    Emphysema Father  Breast cancer Neg Hx     Social History Social History   Tobacco Use   Smoking status: Former   Smokeless tobacco: Never  Substance Use Topics   Alcohol use: Never   Drug use: Never    Review of Systems  Constitutional: + fever, chills, body aches, confusion Eyes: Negative for visual changes. ENT: Negative for sore throat. Neck: No neck pain  Cardiovascular: Negative for chest pain. Respiratory: + shortness of breath and cough Gastrointestinal: Negative for abdominal pain, vomiting or diarrhea. Genitourinary: Negative for dysuria. Musculoskeletal: Negative for back pain. Skin: Negative for  rash. Neurological: Negative for headaches, weakness or numbness. Psych: No SI or HI  ____________________________________________   PHYSICAL EXAM:  VITAL SIGNS: ED Triage Vitals  Enc Vitals Group     BP 07/24/21 2007 115/77     Pulse Rate 07/24/21 2007 98     Resp 07/24/21 2007 (!) 22     Temp 07/24/21 2007 (!) 100.5 F (38.1 C)     Temp Source 07/24/21 2007 Oral     SpO2 07/24/21 2004 (!) 87 %     Weight 07/24/21 2004 220 lb 0.3 oz (99.8 kg)     Height 07/24/21 2004 5\' 3"  (1.6 m)     Head Circumference --      Peak Flow --      Pain Score 07/24/21 2004 0     Pain Loc --      Pain Edu? --      Excl. in [place]? --     Constitutional: Alert and oriented. Well appearing and in no apparent distress. HEENT:      Head: Normocephalic and atraumatic.         Eyes: Conjunctivae are normal. Sclera is non-icteric.       Mouth/Throat: Mucous membranes are moist.       Neck: Supple with no signs of meningismus. Cardiovascular: Regular rate and rhythm. No murmurs, gallops, or rubs. 2+ symmetrical distal pulses are present in all extremities. No JVD. Respiratory: Slightly increased work of breathing with tachypnea, hypoxic to the mid 80s on room air which improved on 3 L nasal cannula, lungs are clear to auscultation with good air movement, no wheezing or crackles. Gastrointestinal: Soft, non tender, and non distended with positive bowel sounds. No rebound or guarding. Genitourinary: No CVA tenderness. Musculoskeletal:  No edema, cyanosis, or erythema of extremities. Neurologic: Normal speech and language. Face is symmetric. Moving all extremities. No gross focal neurologic deficits are appreciated. Skin: Skin is warm, dry and intact. No rash noted. Psychiatric: Mood and affect are normal. Speech and behavior are normal.  ____________________________________________   LABS (all labs ordered are listed, but only abnormal results are displayed)  Labs Reviewed  RESP PANEL BY RT-PCR (FLU  A&B, COVID) ARPGX2 - Abnormal; Notable for the following components:      Result Value   Influenza A by PCR POSITIVE (*)    All other components within normal limits  COMPREHENSIVE METABOLIC PANEL - Abnormal; Notable for the following components:   Sodium 133 (*)    Glucose, Bld 120 (*)    BUN 29 (*)    Creatinine, Ser 1.34 (*)    Calcium 8.7 (*)    Albumin 3.4 (*)    AST 95 (*)    GFR, Estimated 45 (*)    All other components within normal limits  LACTIC ACID, PLASMA - Abnormal; Notable for the following components:   Lactic Acid, Venous 2.3 (*)  All other components within normal limits  CBC WITH DIFFERENTIAL/PLATELET - Abnormal; Notable for the following components:   Monocytes Absolute 1.7 (*)    Abs Immature Granulocytes 0.13 (*)    All other components within normal limits  URINALYSIS, ROUTINE W REFLEX MICROSCOPIC - Abnormal; Notable for the following components:   APPearance HAZY (*)    Hgb urine dipstick TRACE (*)    Bilirubin Urine SMALL (*)    Ketones, ur 15 (*)    Protein, ur TRACE (*)    Leukocytes,Ua MODERATE (*)    WBC, UA >50 (*)    Bacteria, UA MANY (*)    All other components within normal limits  CULTURE, BLOOD (ROUTINE X 2)  CULTURE, BLOOD (ROUTINE X 2)  URINE CULTURE  LACTIC ACID, PLASMA  PROTIME-INR  PROCALCITONIN  PROCALCITONIN   ____________________________________________  EKG  ED ECG REPORT I, Nita Sickle, the attending physician, personally viewed and interpreted this ECG.  Sinus rhythm with rate of 98, normal intervals, normal axis, mild diffuse ST depressions with no ST elevations. ____________________________________________  RADIOLOGY  I have personally reviewed the images performed during this visit and I agree with the Radiologist's read.   Interpretation by Radiologist:  DG Chest 2 View  Result Date: 07/24/2021 CLINICAL DATA:  Altered mental status and shortness of breath. EXAM: CHEST - 2 VIEW COMPARISON:  November 17, 2020 FINDINGS: Mild, diffuse, chronic appearing increased interstitial lung markings are seen. Mild atelectasis is noted within the bilateral lung bases. There is no evidence of a pneumothorax. A small left pleural effusion is seen. There is mild, stable elevation of the left hemidiaphragm. Mild, stable cardiomegaly is seen. The visualized skeletal structures are unremarkable. IMPRESSION: 1. Chronic appearing increased interstitial lung markings. 2. Mild bibasilar atelectasis. 3. Small left pleural effusion. Electronically Signed   By: Aram Candela M.D.   On: 07/24/2021 20:41     ____________________________________________   PROCEDURES  Procedure(s) performed:yes .1-3 Lead EKG Interpretation Performed by: Nita Sickle, MD Authorized by: Nita Sickle, MD     Interpretation: non-specific     ECG rate assessment: tachycardic     Rhythm: sinus tachycardia     Ectopy: none     Conduction: normal     Critical Care performed: yes  CRITICAL CARE Performed by: Nita Sickle  ?  Total critical care time: 35 min  Critical care time was exclusive of separately billable procedures and treating other patients.  Critical care was necessary to treat or prevent imminent or life-threatening deterioration.  Critical care was time spent personally by me on the following activities: development of treatment plan with patient and/or surrogate as well as nursing, discussions with consultants, evaluation of patient's response to treatment, examination of patient, obtaining history from patient or surrogate, ordering and performing treatments and interventions, ordering and review of laboratory studies, ordering and review of radiographic studies, pulse oximetry and re-evaluation of patient's condition.  ____________________________________________   INITIAL IMPRESSION / ASSESSMENT AND PLAN / ED COURSE  65 y.o. female with a history of bipolar disorder, hypertension,  hyperlipidemia, TBI who presents from her group home for confusion in the setting of 3 days of cough, fever, body aches, shortness of breath.  Patient with a fever of 100.5, tachypneic with normal BP and heart rate.  She was hypoxic with sats of 87% on room air which improved on 2 L nasal cannula.  Lungs are clear to auscultation.  Patient is alert and oriented x3 with a GCS of 15.  No  neurological deficits.  Influenza positive and also UA positive for UTI.  Mildly elevated lactic meeting criteria for sepsis.  Patient was started on Rocephin for UTI and Tamiflu, IV fluids based on sepsis protocol.  She was given Tylenol for body aches.  Her blood work also shows mild acute kidney injury for which she is being hydrated intravenously.  Normal white count.  Chest x-ray with no signs of pneumonia, visualized by me confirmed by radiology.  Old medical records review including patient's last visit to her primary care doctor.  Patient was placed on telemetry for close monitoring of cardiorespiratory status      _____________________________________________ Please note:  Patient was evaluated in Emergency Department today for the symptoms described in the history of present illness. Patient was evaluated in the context of the global COVID-19 pandemic, which necessitated consideration that the patient might be at risk for infection with the SARS-CoV-2 virus that causes COVID-19. Institutional protocols and algorithms that pertain to the evaluation of patients at risk for COVID-19 are in a state of rapid change based on information released by regulatory bodies including the CDC and federal and state organizations. These policies and algorithms were followed during the patient's care in the ED.  Some ED evaluations and interventions may be delayed as a result of limited staffing during the pandemic.   [place] Controlled Substance Database was reviewed by me. ____________________________________________   FINAL  CLINICAL IMPRESSION(S) / ED DIAGNOSES   Final diagnoses:  Influenza A  Acute respiratory failure with hypoxia ([place])  Sepsis with acute hypoxic respiratory failure without septic shock, due to unspecified organism [facility])  Acute cystitis with hematuria      NEW MEDICATIONS STARTED DURING THIS VISIT:  ED Discharge Orders     None        Note:  This document was prepared using Dragon voice recognition software and may include unintentional dictation errors.    Rudene Re, MD 07/25/21 [phone]

## 2021-07-24 NOTE — ED Triage Notes (Signed)
Pt to ED via POV with manager from [facility] [phone], per Genevie Cheshire Emergency planning/management officer) pt has been having altered mental status, SOB, intermittent fevers, increased urination x several days. Per Genevie Cheshire pt acting like she is "out of it", states at baseline pt is ambulatory, talkative and interactive with other residents and for the last few days pt has not been activing that way.        Staff on Duty: Elise Benne [phone]. Joneen Roach Sales promotion account executive) [phone]

## 2021-07-24 NOTE — ED Notes (Signed)
Second set of blood cultures drawn by this RN at 2250

## 2021-07-25 DIAGNOSIS — K219 Gastro-esophageal reflux disease without esophagitis: Secondary | ICD-10-CM | POA: Diagnosis present

## 2021-07-25 DIAGNOSIS — J101 Influenza due to other identified influenza virus with other respiratory manifestations: Secondary | ICD-10-CM

## 2021-07-25 DIAGNOSIS — F419 Anxiety disorder, unspecified: Secondary | ICD-10-CM | POA: Diagnosis present

## 2021-07-25 DIAGNOSIS — Z880 Allergy status to penicillin: Secondary | ICD-10-CM | POA: Diagnosis not present

## 2021-07-25 DIAGNOSIS — Z87891 Personal history of nicotine dependence: Secondary | ICD-10-CM | POA: Diagnosis not present

## 2021-07-25 DIAGNOSIS — Z79899 Other long term (current) drug therapy: Secondary | ICD-10-CM | POA: Diagnosis not present

## 2021-07-25 DIAGNOSIS — F319 Bipolar disorder, unspecified: Secondary | ICD-10-CM | POA: Diagnosis present

## 2021-07-25 DIAGNOSIS — J9601 Acute respiratory failure with hypoxia: Secondary | ICD-10-CM | POA: Diagnosis present

## 2021-07-25 DIAGNOSIS — G9341 Metabolic encephalopathy: Secondary | ICD-10-CM | POA: Diagnosis present

## 2021-07-25 DIAGNOSIS — Z6838 Body mass index (BMI) 38.0-38.9, adult: Secondary | ICD-10-CM | POA: Diagnosis not present

## 2021-07-25 DIAGNOSIS — Z20822 Contact with and (suspected) exposure to covid-19: Secondary | ICD-10-CM | POA: Diagnosis present

## 2021-07-25 DIAGNOSIS — I472 Ventricular tachycardia, unspecified: Secondary | ICD-10-CM | POA: Diagnosis not present

## 2021-07-25 DIAGNOSIS — I471 Supraventricular tachycardia: Secondary | ICD-10-CM | POA: Diagnosis not present

## 2021-07-25 DIAGNOSIS — Z87820 Personal history of traumatic brain injury: Secondary | ICD-10-CM | POA: Diagnosis not present

## 2021-07-25 DIAGNOSIS — N3001 Acute cystitis with hematuria: Secondary | ICD-10-CM | POA: Diagnosis present

## 2021-07-25 DIAGNOSIS — I10 Essential (primary) hypertension: Secondary | ICD-10-CM | POA: Diagnosis present

## 2021-07-25 DIAGNOSIS — A4159 Other Gram-negative sepsis: Secondary | ICD-10-CM | POA: Diagnosis present

## 2021-07-25 DIAGNOSIS — Z8249 Family history of ischemic heart disease and other diseases of the circulatory system: Secondary | ICD-10-CM | POA: Diagnosis not present

## 2021-07-25 DIAGNOSIS — E669 Obesity, unspecified: Secondary | ICD-10-CM | POA: Diagnosis present

## 2021-07-25 DIAGNOSIS — E785 Hyperlipidemia, unspecified: Secondary | ICD-10-CM | POA: Diagnosis present

## 2021-07-25 DIAGNOSIS — N179 Acute kidney failure, unspecified: Secondary | ICD-10-CM | POA: Diagnosis present

## 2021-07-25 DIAGNOSIS — R9431 Abnormal electrocardiogram [ECG] [EKG]: Secondary | ICD-10-CM | POA: Diagnosis not present

## 2021-07-25 DIAGNOSIS — A4189 Other specified sepsis: Secondary | ICD-10-CM | POA: Diagnosis present

## 2021-07-25 DIAGNOSIS — Z90710 Acquired absence of both cervix and uterus: Secondary | ICD-10-CM | POA: Diagnosis not present

## 2021-07-25 LAB — CREATININE, SERUM
Creatinine, Ser: 1.01 mg/dL — ABNORMAL HIGH (ref 0.44–1.00)
GFR, Estimated: >60 mL/min (ref >60)

## 2021-07-25 LAB — CBC
HCT: 32.6 % — ABNORMAL LOW (ref 36.0–46.0)
Hemoglobin: 10.5 g/dL — ABNORMAL LOW (ref 12.0–15.0)
MCH: 29.2 pg (ref 26.0–34.0)
MCHC: 32.2 g/dL (ref 30.0–36.0)
MCV: 90.6 fL (ref 80.0–100.0)
Platelets: 173 10*3/uL (ref 150–400)
RBC: 3.6 MIL/uL — ABNORMAL LOW (ref 3.87–5.11)
RDW: 13.7 % (ref 11.5–15.5)
WBC: 7 10*3/uL (ref 4.0–10.5)
nRBC: 0 % (ref 0.0–0.2)

## 2021-07-25 LAB — URINALYSIS, ROUTINE W REFLEX MICROSCOPIC
Glucose, UA: NEGATIVE mg/dL
Ketones, ur: 15 mg/dL — AB
Nitrite: NEGATIVE
Specific Gravity, Urine: 1.025 (ref 1.005–1.030)
WBC, UA: >50 WBC/hpf — ABNORMAL HIGH (ref 0–5)
pH: 5.5 (ref 5.0–8.0)

## 2021-07-25 LAB — PROCALCITONIN
Procalcitonin: 0.11 ng/mL
Procalcitonin: <0.1 ng/mL

## 2021-07-25 LAB — CORTISOL-AM, BLOOD: Cortisol - AM: 16.6 ug/dL (ref 6.7–22.6)

## 2021-07-25 LAB — MAGNESIUM: Magnesium: 2 mg/dL (ref 1.7–2.4)

## 2021-07-25 MED ORDER — PRAVASTATIN SODIUM 20 MG PO TABS
40.0000 mg | ORAL_TABLET | Freq: Every day | ORAL | Status: DC
  Administered 2021-07-26 – 2021-07-27 (×2): 40 mg via ORAL
  Filled 2021-07-25 (×2): qty 2

## 2021-07-25 MED ORDER — RISPERIDONE 1 MG PO TABS
4.0000 mg | ORAL_TABLET | Freq: Two times a day (BID) | ORAL | Status: DC
  Administered 2021-07-25 – 2021-07-27 (×4): 4 mg via ORAL
  Filled 2021-07-25 (×4): qty 4

## 2021-07-25 MED ORDER — AMIODARONE HCL IN DEXTROSE 360-4.14 MG/200ML-% IV SOLN
30.0000 mg/h | INTRAVENOUS | Status: DC
  Administered 2021-07-26: 07:00:00 30 mg/h via INTRAVENOUS
  Filled 2021-07-25 (×2): qty 200

## 2021-07-25 MED ORDER — OXYBUTYNIN CHLORIDE ER 10 MG PO TB24
10.0000 mg | ORAL_TABLET | Freq: Every day | ORAL | Status: DC
  Administered 2021-07-26 – 2021-07-27 (×2): 10 mg via ORAL
  Filled 2021-07-25 (×2): qty 1

## 2021-07-25 MED ORDER — LORAZEPAM 1 MG PO TABS
1.0000 mg | ORAL_TABLET | Freq: Every day | ORAL | Status: DC
  Administered 2021-07-25 – 2021-07-26 (×2): 1 mg via ORAL
  Filled 2021-07-25 (×2): qty 1

## 2021-07-25 MED ORDER — ONDANSETRON HCL 4 MG PO TABS: 4.0000 mg | ORAL_TABLET | Freq: Four times a day (QID) | ORAL | Status: DC | PRN

## 2021-07-25 MED ORDER — AMIODARONE LOAD VIA INFUSION
150.0000 mg | Freq: Once | INTRAVENOUS | Status: AC
  Administered 2021-07-25: 22:00:00 150 mg via INTRAVENOUS
  Filled 2021-07-25: qty 83.34

## 2021-07-25 MED ORDER — ACETAMINOPHEN 650 MG RE SUPP: 650.0000 mg | Freq: Four times a day (QID) | RECTAL | Status: DC | PRN

## 2021-07-25 MED ORDER — BENZTROPINE MESYLATE 1 MG PO TABS
0.5000 mg | ORAL_TABLET | Freq: Two times a day (BID) | ORAL | Status: DC
  Administered 2021-07-25 – 2021-07-27 (×5): 0.5 mg via ORAL
  Filled 2021-07-25 (×5): qty 1

## 2021-07-25 MED ORDER — AMIODARONE HCL IN DEXTROSE 360-4.14 MG/200ML-% IV SOLN
60.0000 mg/h | INTRAVENOUS | Status: DC
  Administered 2021-07-25 – 2021-07-26 (×2): 60 mg/h via INTRAVENOUS
  Filled 2021-07-25 (×2): qty 200

## 2021-07-25 MED ORDER — ACETAMINOPHEN 325 MG PO TABS: 650.0000 mg | ORAL_TABLET | Freq: Four times a day (QID) | ORAL | Status: DC | PRN

## 2021-07-25 MED ORDER — ONDANSETRON HCL 4 MG/2ML IJ SOLN: 4.0000 mg | Freq: Four times a day (QID) | INTRAMUSCULAR | Status: DC | PRN

## 2021-07-25 MED ORDER — SODIUM CHLORIDE 0.9 % IV SOLN
1.0000 g | INTRAVENOUS | Status: DC
  Administered 2021-07-26 – 2021-07-27 (×2): 1 g via INTRAVENOUS
  Filled 2021-07-25 (×2): qty 10

## 2021-07-25 MED ORDER — OSELTAMIVIR PHOSPHATE 75 MG PO CAPS: 75.0000 mg | ORAL_CAPSULE | Freq: Two times a day (BID) | ORAL | Status: DC

## 2021-07-25 MED ORDER — RISPERIDONE 1 MG PO TABS: 4.0000 mg | ORAL_TABLET | Freq: Two times a day (BID) | ORAL | Status: DC

## 2021-07-25 MED ORDER — LOSARTAN POTASSIUM 50 MG PO TABS
100.0000 mg | ORAL_TABLET | Freq: Every day | ORAL | Status: DC
  Administered 2021-07-26: 12:00:00 100 mg via ORAL
  Filled 2021-07-25: qty 2

## 2021-07-25 MED ORDER — RISPERIDONE 1 MG PO TABS
1.0000 mg | ORAL_TABLET | Freq: Two times a day (BID) | ORAL | Status: DC
  Administered 2021-07-25 – 2021-07-27 (×4): 1 mg via ORAL
  Filled 2021-07-25 (×5): qty 1

## 2021-07-25 MED ORDER — RISPERIDONE 1 MG PO TABS: 1.0000 mg | ORAL_TABLET | Freq: Two times a day (BID) | ORAL | Status: DC

## 2021-07-25 MED ORDER — AMLODIPINE BESYLATE 5 MG PO TABS
10.0000 mg | ORAL_TABLET | Freq: Every day | ORAL | Status: DC
  Administered 2021-07-25 – 2021-07-26 (×2): 10 mg via ORAL
  Filled 2021-07-25 (×2): qty 2

## 2021-07-25 MED ORDER — ENOXAPARIN SODIUM 60 MG/0.6ML IJ SOSY
0.5000 mg/kg | PREFILLED_SYRINGE | INTRAMUSCULAR | Status: DC
  Administered 2021-07-25 – 2021-07-27 (×3): 50 mg via SUBCUTANEOUS
  Filled 2021-07-25 (×3): qty 0.6

## 2021-07-25 MED ORDER — DIVALPROEX SODIUM 500 MG PO DR TAB
1000.0000 mg | DELAYED_RELEASE_TABLET | Freq: Every day | ORAL | Status: DC
  Administered 2021-07-25 – 2021-07-26 (×2): 1000 mg via ORAL
  Filled 2021-07-25 (×2): qty 2

## 2021-07-25 MED ORDER — SODIUM CHLORIDE 0.9 % IV SOLN
1.0000 g | Freq: Once | INTRAVENOUS | Status: AC
  Administered 2021-07-25: 02:00:00 1 g via INTRAVENOUS
  Filled 2021-07-25: qty 10

## 2021-07-25 MED ORDER — OSELTAMIVIR PHOSPHATE 75 MG PO CAPS
75.0000 mg | ORAL_CAPSULE | Freq: Two times a day (BID) | ORAL | Status: DC
  Administered 2021-07-25 – 2021-07-27 (×5): 75 mg via ORAL
  Filled 2021-07-25 (×6): qty 1

## 2021-07-25 MED ORDER — POTASSIUM CHLORIDE 20 MEQ PO PACK
40.0000 meq | PACK | Freq: Once | ORAL | Status: AC
  Administered 2021-07-25: 22:00:00 40 meq via ORAL
  Filled 2021-07-25: qty 2

## 2021-07-25 NOTE — Sepsis Progress Note (Signed)
[facility] following for Sepsis Protocol 

## 2021-07-25 NOTE — H&P (Signed)
History and Physical    Debra Rich GEZ:[id number] DOB: [birth year] DOA: 07/24/2021  PCP: System, [name]   Patient coming from: home  I have personally briefly reviewed patient's relevant medical records in [facility]  Chief Complaint: cough and fever  HPI: Debra Rich is a 65 y.o. female with medical history significant for HTN, bipolar mood disorder, anxiety, TBI, seizure-like activity (per Dr. Malvin Johns of neurology clinic note on 07/17/2020), who presents to the ED who was sent from the group home with altered mental status.  At baseline pt is ambulatory, talkative and interactive with other residents and for the last few days pt has not been less so.   Patient is able to contribute to history and states she had 3 days of cough, shortness of breath and fever as well as body aches.  She denies chest pain, nausea, vomiting, abdominal pain and diarrhea.  Cough is nonproductive.  ED course: T-max 100.5, respiratory rate 22 and O2 sat 87% on room air with otherwise normal vitals Blood work: Normal WBC with lactic acid 2.3-1.1.  Procalcitonin 0.11 Creatinine 1.34 up from baseline of 0.68 Influenza A+ Urinalysis with moderate leukocyte esterase and many bacteria  EKG, personally viewed and interpreted: Sinus rhythm at 98 with no acute ST-T wave changes  Imaging: Chest x-ray with chronic appearing increased interstitial lung markings, small left pleural effusion, mild bibasilar atelectasis  Patient started on sepsis fluids, Tamiflu and ceftriaxone as well as supplemental oxygen.  Hospitalist consulted for admission.    Review of Systems: As per HPI otherwise all other systems on review of systems negative.    Past Medical History:  Diagnosis Date   Bipolar 1 disorder (HCC)    GERD (gastroesophageal reflux disease)    HLD (hyperlipidemia)    HTN (hypertension)    TBI (traumatic brain injury)     Past Surgical History:  Procedure Laterality Date   ABDOMINAL  HYSTERECTOMY     pt reported, but per group home manager, this may not be accurate.     reports that she has quit smoking. She has never used smokeless tobacco. She reports that she does not drink alcohol and does not use drugs.  Allergies  Allergen Reactions   Penicillins     Family History  Problem Relation Age of Onset   Diabetes Mellitus II Mother    Hypertension Mother    Emphysema Father    Breast cancer Neg Hx       Prior to Admission medications   Medication Sig Start Date End Date Taking? Authorizing Provider  acetaminophen (TYLENOL) 500 MG tablet Take 1,000 mg by mouth every 8 (eight) hours as needed for pain or fever.    [provider]  amLODipine (NORVASC) 10 MG tablet Take 10 mg by mouth daily.    [provider]  benztropine (COGENTIN) 0.5 MG tablet Take 0.5 mg by mouth 2 (two) times daily.    [provider]  Cholecalciferol 50 MCG (2000 UT) CAPS Take 2,000 Units by mouth daily.    [provider]  divalproex (DEPAKOTE) 500 MG DR tablet Take 1,000 mg by mouth at bedtime.    [provider]  loratadine (CLARITIN) 10 MG tablet Take 10 mg by mouth daily.    [provider]  LORazepam (ATIVAN) 1 MG tablet Take 1 mg by mouth daily.    [provider]  losartan (COZAAR) 100 MG tablet Take 100 mg by mouth daily.    [provider]  Multiple Vitamins-Minerals (THEREMS-M) TABS Take 1 tablet by mouth daily.    [provider]  omeprazole (PRILOSEC) 20 MG capsule Take 20 mg by mouth daily.    [provider]  pravastatin (PRAVACHOL) 40 MG tablet Take 40 mg by mouth daily.    [provider]  risperiDONE (RISPERDAL) 1 MG tablet Take 1 mg by mouth 2 (two) times daily. (take with 4mg  tablet to equal 5mg  twice daily)    [provider]  risperidone (RISPERDAL) 4 MG tablet Take 4 mg by mouth 2 (two) times daily. (take with 1mg  tablet to equal 5mg  twice daily)    [provider]  vitamin E 180 MG (400 UNITS) capsule Take 400 Units by mouth daily.    [provider]    Physical Exam: Vitals:   07/24/21 2236 07/24/21 2350 07/24/21 2351 07/25/21 0129  BP: (!) 109/40 124/64  108/62  Pulse: 97 87  78  Resp: (!) 22 (!) 22  (!) 26  Temp: 99.4 F (37.4 C) (!) 100.8 F (38.2 C)  99.6 F (37.6 C)  TempSrc: Oral Oral  Oral  SpO2: 94% (!) 87% 98% 95%  Weight:      Height:       Constitutional: lethargic and oriented x 3 . Not in any apparent distress HEENT:      Head: Normocephalic and atraumatic.         Eyes: PERLA, EOMI, Conjunctivae are normal. Sclera is non-icteric.       Mouth/Throat: Mucous membranes are moist.       Neck: Supple with no signs of meningismus. Cardiovascular: Regular rate and rhythm. No murmurs, gallops, or rubs. 2+ symmetrical distal pulses are present . No JVD. No  LE edema Respiratory: Respiratory effort normal .Lungs sounds clear bilaterally. No wheezes, crackles, or rhonchi.  Gastrointestinal: Soft, non tender, non distended. Positive bowel sounds.  Genitourinary: No CVA tenderness. Musculoskeletal: Nontender with normal range of motion in all extremities. No cyanosis, or erythema of extremities. Neurologic:  Face is symmetric. Moving all extremities. No gross focal neurologic deficits . Skin: Skin is warm, dry.  No rash or ulcers Psychiatric: Mood and affect are appropriate    Labs on Admission: I have personally reviewed following labs and imaging studies  CBC: Recent Labs  Lab 07/24/21 2015  WBC 7.6  NEUTROABS 4.4  HGB 12.0  HCT 36.4  MCV 89.9  PLT 234   Basic Metabolic Panel: Recent Labs  Lab 07/24/21 2015  NA 133*  K 3.8  CL 101  CO2 24  GLUCOSE 120*  BUN 29*  CREATININE 1.34*  CALCIUM 8.7*   GFR: Estimated Creatinine Clearance: 48.4 mL/min (A) (by C-G formula based on SCr of 1.34 mg/dL (H)). Liver Function Tests: Recent Labs  Lab 07/24/21 2015  AST 95*  ALT 37  ALKPHOS 50   BILITOT 0.7  PROT 6.7  ALBUMIN 3.4*   No results for input(s): LIPASE, AMYLASE in the last 168 hours. No results for input(s): AMMONIA in the last 168 hours. Coagulation Profile: Recent Labs  Lab 07/24/21 2015  INR 1.1   Cardiac Enzymes: No results for input(s): CKTOTAL, CKMB, CKMBINDEX, TROPONINI in the last 168 hours. BNP (last 3 results) No results for input(s): PROBNP in the last 8760 hours. HbA1C: No results for input(s): HGBA1C in the last 72 hours. CBG: No results for input(s): GLUCAP in the last 168 hours. Lipid Profile: No results for input(s): CHOL, HDL, LDLCALC, TRIG, CHOLHDL, LDLDIRECT in the last 72  hours. Thyroid Function Tests: No results for input(s): TSH, T4TOTAL, FREET4, T3FREE, THYROIDAB in the last 72 hours. Anemia Panel: No results for input(s): VITAMINB12, FOLATE, FERRITIN, TIBC, IRON, RETICCTPCT in the last 72 hours. Urine analysis:    Component Value Date/Time   COLORURINE YELLOW 07/24/2021 2015   APPEARANCEUR HAZY (A) 07/24/2021 2015   LABSPEC 1.025 07/24/2021 2015   PHURINE 5.5 07/24/2021 2015   GLUCOSEU NEGATIVE 07/24/2021 2015   HGBUR TRACE (A) 07/24/2021 2015   BILIRUBINUR SMALL (A) 07/24/2021 2015   KETONESUR 15 (A) 07/24/2021 2015   PROTEINUR TRACE (A) 07/24/2021 2015   NITRITE NEGATIVE 07/24/2021 2015   LEUKOCYTESUR MODERATE (A) 07/24/2021 2015    Radiological Exams on Admission: DG Chest 2 View  Result Date: 07/24/2021 CLINICAL DATA:  Altered mental status and shortness of breath. EXAM: CHEST - 2 VIEW COMPARISON:  November 17, 2020 FINDINGS: Mild, diffuse, chronic appearing increased interstitial lung markings are seen. Mild atelectasis is noted within the bilateral lung bases. There is no evidence of a pneumothorax. A small left pleural effusion is seen. There is mild, stable elevation of the left hemidiaphragm. Mild, stable cardiomegaly is seen. The visualized skeletal structures are unremarkable. IMPRESSION: 1. Chronic appearing  increased interstitial lung markings. 2. Mild bibasilar atelectasis. 3. Small left pleural effusion. Electronically Signed   By: Aram Candela M.D.   On: 07/24/2021 20:41    Assessment/Plan    Influenza A   Sepsis (HCC)    Hypoxia -Tamiflu - Sepsis fluids - Symptom control, antitussives, decongestants, antipyretics - Supplemental oxygen  Acute metabolic encephalopathy - Secondary to the above - Improving - Fall and aspiration precautions    HTN (hypertension) - Continue home losartan and amlodipine    UTI (urinary tract infection) - Continue Rocephin - Follow-up cultures    TBI (traumatic brain injury)   Bipolar 1 disorder (HCC) - Continue benztropine, Depakote, Risperdal    DVT prophylaxis: Lovenox  Code Status: full code  Family Communication:  none  Disposition Plan: Back to previous home environment Consults called: none  Status:At the time of admission, it appears that the appropriate admission status for this patient is INPATIENT. This is judged to be reasonable and necessary in order to provide the required intensity of service to ensure the patient's safety given the presenting symptoms, physical exam findings, and initial radiographic and laboratory data in the context of their  Comorbid conditions.   Patient requires inpatient status due to high intensity of service, high risk for further deterioration and high frequency of surveillance required.   I certify that at the point of admission it is my clinical judgment that the patient will require inpatient hospital care spanning beyond 2 midnights    Andris Baumann MD [facility]   07/25/2021, 1:47 AM

## 2021-07-25 NOTE — ED Notes (Signed)
Polymorphic VTach noted on monitor; messaged hospitalist for orders per EDP request

## 2021-07-25 NOTE — Progress Notes (Signed)
PROGRESS NOTE    Debra Rich  BDZ:[id number] DOB: [birth year] DOA: 07/24/2021 PCP: System, [name]   Chief Complaint  Patient presents with   Altered Mental Status    Brief Narrative: 65 yo F with hx HTN, hx TBI, hx bipolar, hx seizure like activity (Dr. Malvin Johns neurology note from 06/2020) who was sent from group home with AMS.  At baseline she's ambulatory, talkative, and interactive.  She's become less so in the last few days.  She was admitted for sepsis from influenza and UTI.   Assessment & Plan:   Principal Problem:   Influenza Debra Rich Active Problems:   HTN (hypertension)   Sepsis (HCC)   UTI (urinary tract infection)   TBI (traumatic brain injury)   Bipolar 1 disorder (HCC)  Acute hypoxic respiratory failure 2/2 influenza Follow  Sepsis 2/2 Influenza Debra Rich Urinary Tract Infection Fever, tachypnea in setting of UTI tamiflu Blood cultures pending UA concerning for UTI - follow culture Continue ceftriaxone AM cortisol 16.6, low suspicion for adrenal insufficiency   Acute Metabolic Encephalopathy 2/2 above, appears resolved Delirium precautions  HTN Losartan, amlodipine  Hx TBI Bipolar Benztropine, depakote, risperdal  DVT prophylaxis: lovenox Code Status: full  Family Communication: none at bedside Disposition:   Status is: Inpatient  Remains inpatient appropriate because: continued need for inpatient care, weaning O2       Consultants:  none  Procedures:    Antimicrobials:  Anti-infectives (From admission, onward)    Start     Dose/Rate Route Frequency Ordered Stop   07/26/21 0100  cefTRIAXone (ROCEPHIN) 1 g in sodium chloride 0.9 % 100 mL IVPB        1 g 200 mL/hr over 30 Minutes Intravenous Every 24 hours 07/25/21 0156     07/25/21 1000  oseltamivir (TAMIFLU) capsule 75 mg        75 mg Oral 2 times daily 07/25/21 0715 07/30/21 0959   07/25/21 0200  oseltamivir (TAMIFLU) capsule 75 mg  Status:  Discontinued        75 mg Oral 2 times  daily 07/25/21 0156 07/25/21 0715   07/25/21 0115  cefTRIAXone (ROCEPHIN) 1 g in sodium chloride 0.9 % 100 mL IVPB        1 g 200 mL/hr over 30 Minutes Intravenous  Once 07/25/21 0109 07/25/21 0254   07/24/21 2345  oseltamivir (TAMIFLU) capsule 75 mg        75 mg Oral  Once 07/24/21 2344 07/25/21 0130          Subjective: Feels Debra Rich little better, maybe 50-60%  Objective: Vitals:   07/25/21 0830 07/25/21 0900 07/25/21 0930 07/25/21 1300  BP: 135/60 99/78 125/65 117/68  Pulse: 69 66 79 69  Resp: 17 20 16 20   Temp:    98.3 F (36.8 C)  TempSrc:    Oral  SpO2: 94% 92% 90% 92%  Weight:      Height:        Intake/Output Summary (Last 24 hours) at 07/25/2021 1511 Last data filed at 07/25/2021 0342 Gross per 24 hour  Intake 2700 ml  Output 1000 ml  Net 1700 ml   Filed Weights   07/24/21 2004  Weight: 99.8 kg    Examination:  General exam: Appears calm and comfortable  Respiratory system: Clear to auscultation. Respiratory effort normal. Cardiovascular system: S1 & S2 heard, RRR.  Gastrointestinal system: Abdomen is nondistended, soft and nontender. Central nervous system: Alert and oriented. No focal neurological deficits. Extremities: no LEE Skin: No  rashes, lesions or ulcers Psychiatry: Judgement and insight appear normal. Mood & affect appropriate.     Data Reviewed: I have personally reviewed following labs and imaging studies  CBC: Recent Labs  Lab 07/24/21 2015 07/25/21 0215  WBC 7.6 7.0  NEUTROABS 4.4  --   HGB 12.0 10.5*  HCT 36.4 32.6*  MCV 89.9 90.6  PLT 234 173    Basic Metabolic Panel: Recent Labs  Lab 07/24/21 2015 07/25/21 0215  NA 133*  --   K 3.8  --   CL 101  --   CO2 24  --   GLUCOSE 120*  --   BUN 29*  --   CREATININE 1.34* 1.01*  CALCIUM 8.7*  --     GFR: Estimated Creatinine Clearance: 64.3 mL/min (Debra Rich) (by C-G formula based on SCr of 1.01 mg/dL (H)).  Liver Function Tests: Recent Labs  Lab 07/24/21 2015  AST 95*   ALT 37  ALKPHOS 50  BILITOT 0.7  PROT 6.7  ALBUMIN 3.4*    CBG: No results for input(s): GLUCAP in the last 168 hours.   Recent Results (from the past 240 hour(s))  Culture, blood (Routine x 2)     Status: None (Preliminary result)   Collection Time: 07/24/21  8:15 PM   Specimen: BLOOD LEFT FOREARM  Result Value Ref Range Status   Specimen Description BLOOD LEFT FOREARM  Final   Special Requests   Final    BOTTLES DRAWN AEROBIC AND ANAEROBIC Blood Culture results may not be optimal due to an excessive volume of blood received in culture bottles   Culture   Final    NO GROWTH < 12 HOURS Performed at [facility], [address] [place], [place] [address]    Report Status PENDING  Incomplete  Culture, blood (Routine x 2)     Status: None (Preliminary result)   Collection Time: 07/24/21  8:15 PM   Specimen: BLOOD  Result Value Ref Range Status   Specimen Description BLOOD BLOOD LEFT FOREARM  Final   Special Requests   Final    BOTTLES DRAWN AEROBIC AND ANAEROBIC Blood Culture results may not be optimal due to an excessive volume of blood received in culture bottles   Culture   Final    NO GROWTH < 12 HOURS Performed at [facility], [address], [place], [place] [address]    Report Status PENDING  Incomplete  Resp Panel by RT-PCR (Flu Debra Rich&B, Covid) Nasopharyngeal Swab     Status: Abnormal   Collection Time: 07/24/21  8:15 PM   Specimen: Nasopharyngeal Swab; [name]    Comment: (NOTE) SARS-CoV-2 target nucleic acids are NOT DETECTED.  The SARS-CoV-2 RNA is generally detectable in upper respiratory specimens during the acute phase of infection. The lowest concentration of SARS-CoV-2 viral copies this assay can detect is 138 copies/mL. Debra Rich negative result does not preclude SARS-Cov-2 infection and should not be used as the sole  basis for treatment or other patient management decisions. Debra Rich negative result may occur with  improper specimen collection/handling, submission of specimen other than nasopharyngeal swab, presence of viral mutation(s) within the areas targeted by this assay, and inadequate number of viral copies(<138 copies/mL). Adonijah Baena negative result must be combined with clinical observations, patient history, and epidemiological information. The expected result is Negative.  Fact Sheet for Patients:  BloggerCourse.com  Fact Sheet  for Healthcare Providers:  SeriousBroker.it  This test is no t yet approved or cleared by the [place] and  has been authorized for detection and/or diagnosis of SARS-CoV-2 by FDA under an Emergency Use Authorization (EUA). This EUA will remain  in effect (meaning this test can be used) for the duration of the COVID-19 declaration under Section 564(b)(1) of the Act, 21 U.S.C.section 360bbb-3(b)(1), unless the authorization is terminated  or revoked sooner.       Influenza Margarete Horace by PCR POSITIVE (Nahomi Hegner) NEGATIVE Final   Influenza B by PCR NEGATIVE NEGATIVE Final    Comment: (NOTE) The Xpert Xpress SARS-CoV-2/FLU/RSV plus assay is intended as an aid in the diagnosis of influenza from Nasopharyngeal swab specimens and should not be used as Makenzy Krist sole basis for treatment. Nasal washings and aspirates are unacceptable for Xpert Xpress SARS-CoV-2/FLU/RSV testing.  Fact Sheet for Patients: BloggerCourse.com  Fact Sheet for Healthcare Providers: SeriousBroker.it  This test is not yet approved or cleared by the [place] FDA and has been authorized for detection and/or diagnosis of SARS-CoV-2 by FDA under an Emergency Use Authorization (EUA). This EUA will remain in effect (meaning this test can be used) for the duration of the COVID-19 declaration under Section 564(b)(1) of the  Act, 21 U.S.C. section 360bbb-3(b)(1), unless the authorization is terminated or revoked.  Performed at [facility], [address], [place], [place] [address]          Radiology Studies: DG Chest 2 View  Result Date: 07/24/2021 CLINICAL DATA:  Altered mental status and shortness of breath. EXAM: CHEST - 2 VIEW COMPARISON:  November 17, 2020 FINDINGS: Mild, diffuse, chronic appearing increased interstitial lung markings are seen. Mild atelectasis is noted within the bilateral lung bases. There is no evidence of Jerrod Damiano pneumothorax. Svetlana Bagby small left pleural effusion is seen. There is mild, stable elevation of the left hemidiaphragm. Mild, stable cardiomegaly is seen. The visualized skeletal structures are unremarkable. IMPRESSION: 1. Chronic appearing increased interstitial lung markings. 2. Mild bibasilar atelectasis. 3. Small left pleural effusion. Electronically Signed   By: Aram Candela M.D.   On: 07/24/2021 20:41        Scheduled Meds:  enoxaparin (LOVENOX) injection  0.5 mg/kg Subcutaneous Q24H   oseltamivir  75 mg Oral BID   Continuous Infusions:  [START ON 07/26/2021] cefTRIAXone (ROCEPHIN)  IV     lactated ringers 150 mL/hr at 07/25/21 0644     LOS: 0 days    Time spent: over 30 min    Lacretia Nicks, MD [facility]   To contact the attending provider between 7A-7P or the covering provider during after hours 7P-7A, please log into the web site [email or web address] and access using universal [place] password for that web site. If you do not have the password, please call the hospital operator.  07/25/2021, 3:11 PM

## 2021-07-25 NOTE — Progress Notes (Signed)
Anticoagulation monitoring(Lovenox):  65 yo  female ordered Lovenox 40 mg Q24h    Filed Weights   07/24/21 2004  Weight: 99.8 kg (220 lb 0.3 oz)   BMI  39  Lab Results  Component Value Date   CREATININE 1.01 (H) 07/25/2021   CREATININE 1.34 (H) 07/24/2021   CREATININE 0.74 11/17/2020   Estimated Creatinine Clearance: 64.3 mL/min (A) (by C-G formula based on SCr of 1.01 mg/dL (H)). Hemoglobin & Hematocrit     Component Value Date/Time   HGB 10.5 (L) 07/25/2021 0215   HCT 32.6 (L) 07/25/2021 0215     Per Protocol for Patient with estCrcl > 30 ml/min and BMI > 30, will transition to Lovenox 50 mg Q24h.

## 2021-07-25 NOTE — Sepsis Progress Note (Signed)
MD at the time of service was notified that Sepsis called with No Abx's ordered with recognition of + Flu, MD did recognize secure chat with response of sepsis due to flu

## 2021-07-25 NOTE — ED Notes (Signed)
Pt eating lunch at the moment, ao x 4. Denies any complaints at this time.

## 2021-07-25 NOTE — Progress Notes (Signed)
CODE SEPSIS - PHARMACY COMMUNICATION  **Broad Spectrum Antibiotics should be administered within 1 hour of Sepsis diagnosis**  Time Code Sepsis Called/Page Received: 11/26 @ 2344   Antibiotics Ordered: Ceftriaxone , Oseltamivir   Time of 1st antibiotic administration: Oseltamivir 75 mg PO X 1 on 11/27 @ 0130  Additional action taken by pharmacy: Messaged MD around 0030 to remind to order abx,  MD stated sepsis was due to influenza A ,  Messaged RN to give Tamiflu   If necessary, Name of Provider/Nurse Contacted:     Kaley Jutras D ,PharmD Clinical Pharmacist  07/25/2021  1:39 AM

## 2021-07-25 NOTE — ED Notes (Signed)
Patient alert, oriented to name, place and time. Resp even, unlabored on 2L per [place]. Denies pain or needs at this time.

## 2021-07-26 ENCOUNTER — Inpatient Hospital Stay (HOSPITAL_COMMUNITY)
Admit: 2021-07-26 | Discharge: 2021-07-26 | Disposition: A | Discharge status: Home / Self Care | Payer: Medicare Other | Attending: Internal Medicine | Admitting: Internal Medicine

## 2021-07-26 DIAGNOSIS — I471 Supraventricular tachycardia: Secondary | ICD-10-CM

## 2021-07-26 DIAGNOSIS — R9431 Abnormal electrocardiogram [ECG] [EKG]: Secondary | ICD-10-CM | POA: Diagnosis not present

## 2021-07-26 DIAGNOSIS — J101 Influenza due to other identified influenza virus with other respiratory manifestations: Secondary | ICD-10-CM | POA: Diagnosis not present

## 2021-07-26 LAB — CBC WITH DIFFERENTIAL/PLATELET
Abs Immature Granulocytes: 0.02 10*3/uL (ref 0.00–0.07)
Basophils Absolute: 0 10*3/uL (ref 0.0–0.1)
Basophils Relative: 1 %
Eosinophils Absolute: 0 10*3/uL (ref 0.0–0.5)
Eosinophils Relative: 0 %
HCT: 36.2 % (ref 36.0–46.0)
Hemoglobin: 11.8 g/dL — ABNORMAL LOW (ref 12.0–15.0)
Immature Granulocytes: 0 %
Lymphocytes Relative: 51 %
Lymphs Abs: 2.4 10*3/uL (ref 0.7–4.0)
MCH: 29.4 pg (ref 26.0–34.0)
MCHC: 32.6 g/dL (ref 30.0–36.0)
MCV: 90.3 fL (ref 80.0–100.0)
Monocytes Absolute: 0.8 10*3/uL (ref 0.1–1.0)
Monocytes Relative: 18 %
Neutro Abs: 1.4 10*3/uL — ABNORMAL LOW (ref 1.7–7.7)
Neutrophils Relative %: 30 %
Platelets: 178 10*3/uL (ref 150–400)
RBC: 4.01 MIL/uL (ref 3.87–5.11)
RDW: 13.4 % (ref 11.5–15.5)
WBC: 4.7 10*3/uL (ref 4.0–10.5)
nRBC: 0 % (ref 0.0–0.2)

## 2021-07-26 LAB — COMPREHENSIVE METABOLIC PANEL
ALT: 37 U/L (ref 0–44)
AST: 68 U/L — ABNORMAL HIGH (ref 15–41)
Albumin: 3 g/dL — ABNORMAL LOW (ref 3.5–5.0)
Alkaline Phosphatase: 42 U/L (ref 38–126)
Anion gap: 7 (ref 5–15)
BUN: 12 mg/dL (ref 8–23)
CO2: 30 mmol/L (ref 22–32)
Calcium: 8.7 mg/dL — ABNORMAL LOW (ref 8.9–10.3)
Chloride: 102 mmol/L (ref 98–111)
Creatinine, Ser: 0.58 mg/dL (ref 0.44–1.00)
GFR, Estimated: >60 mL/min (ref >60)
Glucose, Bld: 109 mg/dL — ABNORMAL HIGH (ref 70–99)
Potassium: 3.6 mmol/L (ref 3.5–5.1)
Sodium: 139 mmol/L (ref 135–145)
Total Bilirubin: 0.4 mg/dL (ref 0.3–1.2)
Total Protein: 6 g/dL — ABNORMAL LOW (ref 6.5–8.1)

## 2021-07-26 LAB — ECHOCARDIOGRAM COMPLETE
AR max vel: 2.56 cm2
AV Area VTI: 2.37 cm2
AV Area mean vel: 2.46 cm2
AV Mean grad: 2 mmHg
AV Peak grad: 3.8 mmHg
Ao pk vel: 0.98 m/s
Area-P 1/2: 4.86 cm2
Height: 63 in
MV VTI: 1.85 cm2
S' Lateral: 2.9 cm
Weight: 3520.31 oz

## 2021-07-26 LAB — PROCALCITONIN: Procalcitonin: <0.1 ng/mL

## 2021-07-26 LAB — PHOSPHORUS: Phosphorus: 3.5 mg/dL (ref 2.5–4.6)

## 2021-07-26 LAB — MAGNESIUM: Magnesium: 2 mg/dL (ref 1.7–2.4)

## 2021-07-26 NOTE — Progress Notes (Signed)
*  PRELIMINARY RESULTS* Echocardiogram 2D Echocardiogram has been performed.  Debra Rich 07/26/2021, 1:14 PM

## 2021-07-26 NOTE — ED Notes (Signed)
Pt repositioned in the bed. Pt placed on Purewick at this time. Lunch meal tray given.

## 2021-07-26 NOTE — Consult Note (Signed)
Cardiology Consultation:   Patient ID: Emylee Bolter MRN: [id number]; DOB: [birth year]  Admit date: 07/24/2021 Date of Consult: 07/26/2021  PCP:  System, [name]   [facility] Providers Cardiologist:  New}    Patient Profile:   Kriselda Spillman is a 65 y.o. female with a hx of HTN, bipolar mood disorder, anxiety, seizure-like activity who is being seen 07/26/2021 for the evaluation of NSVT at the request of Dr. Allena Katz.  History of Present Illness:   Ms. Houdeshell has no significant cardiac history. Lives in assisted living center. No h/o MI, stent, CHF. Family history negative for heart disease. No alcohol or drug history. No smoking history.    The patient presented to the ER 07/26/21 for AMS. Most hsitory provided by chart review as patient still confused as to what brought her to the ER. At baseline patient is ambulatory and interactive. Reported has not been herself the alst few days.Pt reported cough, SOB, body aches. No chest pain, nausea, vomiting, abdominal pain, and diarrhea.   In the ER temo 100.20F, RR 22, O287%. Labs showed normal WBC. LA 2.3> 1.1. Procal 0.11. sodium 133, Scr 1.34, BUN 29, AST 95, ALT 37, ALT 37. Influenza A positive. UA with moderate leukocyte esterase and bacteria. EKG showed SR at 98 bpm. CXR showed interstitial lung markings, small left pleural effusion, mild bibasilar atelectasis. Started on IVF, Tamiflu and abx. Patient was admitted  Note to have NSVT and cardiology consulted. Tele shows SVT, possible afib/flutter with rates up to 120s started on IV amiodarone.    Past Medical History:  Diagnosis Date   Bipolar 1 disorder (HCC)    GERD (gastroesophageal reflux disease)    HLD (hyperlipidemia)    HTN (hypertension)    TBI (traumatic brain injury)     Past Surgical History:  Procedure Laterality Date   ABDOMINAL HYSTERECTOMY     pt reported, but per group home manager, this may not be accurate.     Home Medications:  Prior to Admission  medications   Medication Sig Start Date End Date Taking? Authorizing Provider  acetaminophen (TYLENOL) 500 MG tablet Take 1,000 mg by mouth every 8 (eight) hours as needed for pain or fever.   Yes [provider]  amLODipine (NORVASC) 10 MG tablet Take 10 mg by mouth daily.   Yes [provider]  benztropine (COGENTIN) 0.5 MG tablet Take 0.5 mg by mouth 2 (two) times daily.   Yes [provider]  Cholecalciferol 25 MCG (1000 UT) tablet Take 2,000 Units by mouth daily.   Yes [provider]  divalproex (DEPAKOTE) 500 MG DR tablet Take 1,000 mg by mouth at bedtime.   Yes [provider]  loratadine (CLARITIN) 10 MG tablet Take 10 mg by mouth daily.   Yes [provider]  LORazepam (ATIVAN) 1 MG tablet Take 1 mg by mouth at bedtime.   Yes [provider]  losartan (COZAAR) 100 MG tablet Take 100 mg by mouth daily.   Yes [provider]  meloxicam (MOBIC) 7.5 MG tablet Take 7.5 mg by mouth daily as needed for pain.   Yes [provider]  metFORMIN (GLUCOPHAGE) 500 MG tablet Take 500 mg by mouth in the morning.   Yes [provider]  Multiple Vitamins-Minerals (THEREMS-M) TABS Take 1 tablet by mouth daily.   Yes [provider]  omeprazole (PRILOSEC) 20 MG capsule Take 20 mg by mouth daily.   Yes [provider]  oxybutynin (DITROPAN-XL) 10 MG 24  hr tablet Take 10 mg by mouth daily.   Yes [provider]  pravastatin (PRAVACHOL) 40 MG tablet Take 40 mg by mouth daily.   Yes [provider]  risperiDONE (RISPERDAL) 1 MG tablet Take 1 mg by mouth 2 (two) times daily. (Take with 4mg  tablets to equal 5mg  total)   Yes [provider]  risperidone (RISPERDAL) 4 MG tablet Take 4 mg by mouth 2 (two) times daily. (Take with 1mg  tablets to equal 5mg  total)   Yes [provider]  vitamin E 180 MG (400 UNITS) capsule Take 400 Units by mouth daily.   Yes [provider]    Inpatient Medications: Scheduled Meds:  amLODipine  10 mg Oral Daily   benztropine  0.5 mg Oral BID   divalproex  1,000 mg Oral QHS   enoxaparin (LOVENOX) injection  0.5 mg/kg Subcutaneous Q24H   LORazepam  1 mg Oral QHS   losartan  100 mg Oral Daily   oseltamivir  75 mg Oral BID   oxybutynin  10 mg Oral Daily   pravastatin  40 mg Oral Daily   risperiDONE  4 mg Oral BID   And   risperiDONE  1 mg Oral BID   Continuous Infusions:  amiodarone 30 mg/hr (07/26/21 0710)   cefTRIAXone (ROCEPHIN)  IV Stopped (07/26/21 0310)   PRN Meds: acetaminophen **OR** acetaminophen, ondansetron **OR** ondansetron (ZOFRAN) IV  Allergies:    Allergies  Allergen Reactions   Penicillins     Social History:   Social History   Socioeconomic History   Marital status: Single    Spouse name: Not on file   Number of children: Not on file   Years of education: Not on file   Highest education level: Not on file  Occupational History   Not on file  Tobacco Use   Smoking status: Former   Smokeless tobacco: Never  Substance and Sexual Activity   Alcohol use: Never   Drug use: Never   Sexual activity: Not on file  Other Topics Concern   Not on file  Social History Narrative   Not on file   Social Determinants of Health   Financial Resource Strain: Not on file  Food Insecurity: Not on file  Transportation Needs: Not on file  Physical Activity: Not on file  Stress: Not on file  Social Connections: Not on file  Intimate Partner Violence: Not on file    Family History:    Family History  Problem Relation Age of Onset   Diabetes Mellitus II Mother    Hypertension Mother    Emphysema Father    Breast cancer Neg Hx      ROS:  Please see the history of present illness.   All other ROS reviewed and negative.     Physical Exam/Data:   Vitals:   07/26/21 0700 07/26/21 0730 07/26/21 0800 07/26/21 0830  BP: 105/82 100/62 (!) 102/59 (!) 118/50  Pulse: 61 (!) 58 (!)  54 61  Resp: 18 16 14 14   Temp:      TempSrc:      SpO2: 94% 93% 92% 95%  Weight:      Height:        Intake/Output Summary (Last 24 hours) at 07/26/2021 1029 Last data filed at 07/26/2021 0710 Gross per 24 hour  Intake 1202.35 ml  Output 2000 ml  Net -797.65 ml   Last 3 Weights 07/24/2021 11/09/2020  Weight (lbs) 220 lb 0.3 oz 220 lb  Weight (kg)  99.8 kg 99.791 kg     Body mass index is 38.97 kg/m.  General:  Well nourished, well developed, in no acute distress HEENT: normal Neck: no JVD Vascular: No carotid bruits; Distal pulses 2+ bilaterally Cardiac:  normal S1, S2; RRR; no murmur  Lungs:  clear to auscultation bilaterally, no wheezing, rhonchi or rales  Abd: soft, nontender, no hepatomegaly  Ext: no edema Musculoskeletal:  No deformities, BUE and BLE strength normal and equal Skin: warm and dry  Neuro:  CNs 2-12 intact, no focal abnormalities noted Psych:  Normal affect   EKG:  The EKG was personally reviewed and demonstrates:from 11/26  NSR, 98bpm, possible ST depression II and aVF, nonspecific T wave changes Telemetry:  Telemetry was personally reviewed and demonstrates:  NSR, HR 60s, appears to be artifact during possible episodes of NSVT given clear QRS that marches out.   Relevant CV Studies:  Echo ordered  Laboratory Data:  High Sensitivity Troponin:  No results for input(s): TROPONINIHS in the last 720 hours.   Chemistry Recent Labs  Lab 07/24/21 2015 07/25/21 0215 07/25/21 0628 07/26/21 0625  NA 133*  --   --  139  K 3.8  --   --  3.6  CL 101  --   --  102  CO2 24  --   --  30  GLUCOSE 120*  --   --  109*  BUN 29*  --   --  12  CREATININE 1.34* 1.01*  --  0.58  CALCIUM 8.7*  --   --  8.7*  MG  --   --  2.0 2.0  GFRNONAA 45* >60  --  >60  ANIONGAP 8  --   --  7    Recent Labs  Lab 07/24/21 2015 07/26/21 0625  PROT 6.7 6.0*  ALBUMIN 3.4* 3.0*  AST 95* 68*  ALT 37 37  ALKPHOS 50 42  BILITOT 0.7 0.4   Lipids No results for input(s):  CHOL, TRIG, HDL, LABVLDL, LDLCALC, CHOLHDL in the last 168 hours.  Hematology Recent Labs  Lab 07/24/21 2015 07/25/21 0215 07/26/21 0625  WBC 7.6 7.0 4.7  RBC 4.05 3.60* 4.01  HGB 12.0 10.5* 11.8*  HCT 36.4 32.6* 36.2  MCV 89.9 90.6 90.3  MCH 29.6 29.2 29.4  MCHC 33.0 32.2 32.6  RDW 13.7 13.7 13.4  PLT 234 173 178   Thyroid No results for input(s): TSH, FREET4 in the last 168 hours.  BNPNo results for input(s): BNP, PROBNP in the last 168 hours.  DDimer No results for input(s): DDIMER in the last 168 hours.   Radiology/Studies:  DG Chest 2 View  Result Date: 07/24/2021 CLINICAL DATA:  Altered mental status and shortness of breath. EXAM: CHEST - 2 VIEW COMPARISON:  November 17, 2020 FINDINGS: Mild, diffuse, chronic appearing increased interstitial lung markings are seen. Mild atelectasis is noted within the bilateral lung bases. There is no evidence of a pneumothorax. A small left pleural effusion is seen. There is mild, stable elevation of the left hemidiaphragm. Mild, stable cardiomegaly is seen. The visualized skeletal structures are unremarkable. IMPRESSION: 1. Chronic appearing increased interstitial lung markings. 2. Mild bibasilar atelectasis. 3. Small left pleural effusion. Electronically Signed   By: Aram Candela M.D.   On: 07/24/2021 20:41     Assessment and Plan:   Abnormal EKG ?NSVT - abnormal EKG in the setting of AMS, UTI, and FLU+ noted to have possible NSVT started on IV amiodarone - Patient unable to account prior  events. Denies chest pain or SOB - No EKG for review - After review of tele appears to be possible artifact. QRS marches out. Do no suspect NSVT. If it were afib/flutter would be more prolonged. Possibly bursts of SVT, but not likely. MD to review - would stop IV amiodarone - echo ordered  AMS SEPSIS UTI FLU Bipolar 1 - tx per IM  For questions or updates, please contact [facility] Please consult [email or web address] for contact info under     Signed, Diedra Sinor David Stall, PA-C  07/26/2021 10:29 AM

## 2021-07-26 NOTE — ED Notes (Signed)
Breakfast tray given. °

## 2021-07-26 NOTE — Progress Notes (Signed)
[facility]  - [place] [facility]   PATIENT NAME: Debra Rich    MR#:  [id number]  DATE OF BIRTH:  09/27/1955  SUBJECTIVE:   patient is brought in from group home with altered mental status. She was able to answer most of my questions appropriately. No family or group home member at bedside. Patient says she feels bit better. She did eat breakfast. Breathing is improving. Oxygen sats 95 to 97% on room air. Last p.m. patient went into arrhythmia appears and SVT versus a fib flutter. Started on IV amiodarone drip. Heart rate in the 60s. Appears normal sinus rhythm at present. REVIEW OF SYSTEMS:   Review of Systems  Constitutional:  Negative for chills, fever and weight loss.  HENT:  Negative for ear discharge, ear pain and nosebleeds.   Eyes:  Negative for blurred vision, pain and discharge.  Respiratory:  Positive for shortness of breath. Negative for sputum production, wheezing and stridor.   Cardiovascular:  Negative for chest pain, palpitations, orthopnea and PND.  Gastrointestinal:  Negative for abdominal pain, diarrhea, nausea and vomiting.  Genitourinary:  Negative for frequency and urgency.  Musculoskeletal:  Negative for back pain and joint pain.  Neurological:  Positive for weakness. Negative for sensory change, speech change and focal weakness.  Psychiatric/Behavioral:  Negative for depression and hallucinations. The patient is not nervous/anxious.   Tolerating Diet:yes Tolerating PT:   DRUG ALLERGIES:   Allergies  Allergen Reactions   Penicillins     VITALS:  Blood pressure (!) 93/54, pulse 63, temperature 98.3 F (36.8 C), temperature source Oral, resp. rate 18, height 5\' 3"  (1.6 m), weight 99.8 kg, SpO2 94 %.  PHYSICAL EXAMINATION:   Physical Exam  GENERAL:  65 y.o.-year-old patient lying in the bed with no acute distress. Obese HEENT: Head atraumatic, normocephalic. Oropharynx and nasopharynx clear.  LUNGS:decreased sounds bilaterally, no  wheezing, rales, rhonchi. No use of accessory muscles of respiration.  CARDIOVASCULAR: S1, S2 normal. No murmurs, rubs, or gallops.  ABDOMEN: Soft, nontender, nondistended. Bowel sounds present. No organomegaly or mass.  EXTREMITIES: No cyanosis, clubbing or edema b/l.    NEUROLOGIC: nonfocal PSYCHIATRIC:  patient is alert and awake SKIN: No obvious rash, lesion, or ulcer.   LABORATORY PANEL:  CBC Recent Labs  Lab 07/26/21 0625  WBC 4.7  HGB 11.8*  HCT 36.2  PLT 178    Chemistries  Recent Labs  Lab 07/26/21 0625  NA 139  K 3.6  CL 102  CO2 30  GLUCOSE 109*  BUN 12  CREATININE 0.58  CALCIUM 8.7*  MG 2.0  AST 68*  ALT 37  ALKPHOS 42  BILITOT 0.4   Cardiac Enzymes No results for input(s): TROPONINI in the last 168 hours. RADIOLOGY:  DG Chest 2 View  Result Date: 07/24/2021 CLINICAL DATA:  Altered mental status and shortness of breath. EXAM: CHEST - 2 VIEW COMPARISON:  November 17, 2020 FINDINGS: Mild, diffuse, chronic appearing increased interstitial lung markings are seen. Mild atelectasis is noted within the bilateral lung bases. There is no evidence of a pneumothorax. A small left pleural effusion is seen. There is mild, stable elevation of the left hemidiaphragm. Mild, stable cardiomegaly is seen. The visualized skeletal structures are unremarkable. IMPRESSION: 1. Chronic appearing increased interstitial lung markings. 2. Mild bibasilar atelectasis. 3. Small left pleural effusion. Electronically Signed   By: November 19, 2020 M.D.   On: 07/24/2021 20:41   ECHOCARDIOGRAM COMPLETE  Result Date: 07/26/2021    ECHOCARDIOGRAM REPORT  Patient Name:   Debra Rich Date of Exam: 07/26/2021 Medical Rec #:  [id number]   Height:       63.0 in Accession #:    [id number]  Weight:       220.0 lb Date of Birth:  02/05/1956  BSA:          2.014 m Patient Age:    65 years    BP:           107/62 mmHg Patient Gender: F           HR:           Not listed in chart bpm. Exam Location:  [facility]  Procedure: 2D Echo, Cardiac Doppler and Color Doppler Indications:     Abnormal ECG R94.31  History:         Patient has no prior history of Echocardiogram examinations.                  Risk Factors:Hypertension and Dyslipidemia.  Sonographer:     Cristela Blue Referring Phys:  [id number] Elveria Lauderbaugh Diagnosing Phys: Lorine Bears MD  Sonographer Comments: Suboptimal apical window and no subcostal window. IMPRESSIONS  1. Left ventricular ejection fraction, by estimation, is 55 to 60%. The left ventricle has normal function. Left ventricular endocardial border not optimally defined to evaluate regional wall motion. There is mild left ventricular hypertrophy. Left ventricular diastolic parameters were normal.  2. Right ventricular systolic function is normal. The right ventricular size is normal. Tricuspid regurgitation signal is inadequate for assessing PA pressure.  3. The mitral valve is normal in structure. No evidence of mitral valve regurgitation. No evidence of mitral stenosis.  4. The aortic valve is normal in structure. Aortic valve regurgitation is not visualized. No aortic stenosis is present. FINDINGS  Left Ventricle: Left ventricular ejection fraction, by estimation, is 55 to 60%. The left ventricle has normal function. Left ventricular endocardial border not optimally defined to evaluate regional wall motion. The left ventricular internal cavity size was normal in size. There is mild left ventricular hypertrophy. Left ventricular diastolic parameters were normal. Right Ventricle: The right ventricular size is normal. No increase in right ventricular wall thickness. Right ventricular systolic function is normal. Tricuspid regurgitation signal is inadequate for assessing PA pressure. Left Atrium: Left atrial size was normal in size. Right Atrium: Right atrial size was normal in size. Pericardium: There is no evidence of pericardial effusion. Mitral Valve: The mitral valve is normal in structure. No evidence of  mitral valve regurgitation. No evidence of mitral valve stenosis. MV peak gradient, 4.8 mmHg. The mean mitral valve gradient is 2.0 mmHg. Tricuspid Valve: The tricuspid valve is normal in structure. Tricuspid valve regurgitation is not demonstrated. No evidence of tricuspid stenosis. Aortic Valve: The aortic valve is normal in structure. Aortic valve regurgitation is not visualized. No aortic stenosis is present. Aortic valve mean gradient measures 2.0 mmHg. Aortic valve peak gradient measures 3.8 mmHg. Aortic valve area, by VTI measures 2.37 cm. Pulmonic Valve: The pulmonic valve was normal in structure. Pulmonic valve regurgitation is not visualized. No evidence of pulmonic stenosis. Aorta: The aortic root is normal in size and structure. Venous: The inferior vena cava was not well visualized. IAS/Shunts: No atrial level shunt detected by color flow Doppler.  LEFT VENTRICLE PLAX 2D LVIDd:         4.80 cm   Diastology LVIDs:         2.90 cm   LV e' medial:  5.55 cm/s LV PW:         1.20 cm   LV E/e' medial:  16.0 LV IVS:        0.90 cm   LV e' lateral:   9.90 cm/s LVOT diam:     2.00 cm   LV E/e' lateral: 9.0 LV SV:         54 LV SV Index:   27 LVOT Area:     3.14 cm  RIGHT VENTRICLE RV Basal diam:  3.50 cm RV S prime:     13.40 cm/s TAPSE (M-mode): 3.2 cm LEFT ATRIUM             Index        RIGHT ATRIUM           Index LA diam:        2.20 cm 1.09 cm/m   RA Area:     17.00 cm LA Vol (A2C):   42.7 ml 21.21 ml/m  RA Volume:   44.90 ml  22.30 ml/m LA Vol (A4C):   36.8 ml 18.28 ml/m LA Biplane Vol: 41.2 ml 20.46 ml/m  AORTIC VALVE                    PULMONIC VALVE AV Area (Vmax):    2.56 cm     PV Vmax:        0.67 m/s AV Area (Vmean):   2.46 cm     PV Vmean:       45.550 cm/s AV Area (VTI):     2.37 cm     PV VTI:         0.147 m AV Vmax:           97.60 cm/s   PV Peak grad:   1.8 mmHg AV Vmean:          69.500 cm/s  PV Mean grad:   1.0 mmHg AV VTI:            0.227 m      RVOT Peak grad: 3 mmHg AV Peak  Grad:      3.8 mmHg AV Mean Grad:      2.0 mmHg LVOT Vmax:         79.60 cm/s LVOT Vmean:        54.400 cm/s LVOT VTI:          0.171 m LVOT/AV VTI ratio: 0.75  AORTA Ao Root diam: 3.30 cm MITRAL VALVE               TRICUSPID VALVE MV Area (PHT): 4.86 cm    TR Peak grad:   13.8 mmHg MV Area VTI:   1.85 cm    TR Vmax:        186.00 cm/s MV Peak grad:  4.8 mmHg MV Mean grad:  2.0 mmHg    SHUNTS MV Vmax:       1.09 m/s    Systemic VTI:  0.17 m MV Vmean:      57.4 cm/s   Systemic Diam: 2.00 cm MV Decel Time: 156 msec    Pulmonic VTI:  0.157 m MV E velocity: 88.70 cm/s MV A velocity: 75.00 cm/s MV E/A ratio:  1.18 Lorine Bears MD Electronically signed by Lorine Bears MD Signature Date/Time: 07/26/2021/1:29:32 PM    Final    ASSESSMENT AND PLAN:  65 yo F with hx HTN, hx TBI, hx bipolar, hx seizure like activity (Dr. Malvin Johns neurology note from 06/2020)  who was sent from group home with AMS.  At baseline she's ambulatory, talkative, and interactive.  She's become less so in the last few days.  She was admitted for sepsis from influenza and UTI.  Acute hypoxic respiratory failure secondary to influenza -- patient saturations currently on room air 95 to 97% -- continue Tamiflu -- PRN nebulizer if needed  sepsis secondary to influenza and UTI -- patient came in with fever tachypnea and abnormal urine -- patient unable to give me history of dysuria. -- Urine quite abnormal -- currently on IV Rocephin will change to oral once culture results back  Hypertension -- continue losartan and amlodipine  history of bipolar disorder  history of traumatic brain injury -- patient is from group home -- continue her benztropine, Depakote, Risperdal  Procedures: Family communication :Child psychotherapist Consults:none CODE STATUS: FULL DVT Prophylaxis :enoxaparin Level of care: Progressive Status is: Inpatient  Remains inpatient appropriate because: influenza        TOTAL TIME TAKING  CARE OF THIS PATIENT: 25 minutes.  >50% time spent on counselling and coordination of care  Note: This dictation was prepared with Dragon dictation along with smaller phrase technology. Any transcriptional errors that result from this process are unintentional.  Enedina Finner M.D    [facility]   CC: Primary care physician; System, [name] Patient ID: Daphyne Miguez, female   DOB: [birth year], 65 y.o.   MRN: [id number]

## 2021-07-26 NOTE — Evaluation (Signed)
Physical Therapy Evaluation Patient Details Name: Debra Rich MRN: [id number] DOB: 02-23-1956 Today's Date: 07/26/2021  History of Present Illness  Debra Rich is a 65 y.o. female with a history of bipolar disorder, hypertension, hyperlipidemia, TBI who presents from her group home for altered mental status.   Clinical Impression  Pt admitted with above diagnosis. Pt received agreeable to PT services. Reports PLOF, living in group home and assist needed at baseline without difficulty. Resting comfortably on 1L via [place] with O2 >98%. Trialed RA with SPO2 at 94% at rest. Pt required minA and HOB elevated to sit EOB with truncal support. 4" step applied at pt's feet due to height of ED bed. Able to stand with VC"s for hand placement and minguard for 2-3 min maintaining static standing without AD and able to reach within BOS without LOB to assist RN in gown mgmt to assist for pt clean up. Pt displays good eccentric control with stepping off of step and amb with RW within room 30'. Pt slow moving requesting seated rest in chair endorsing LE fatigue but no LOB or balance concerns with use of RW. In sitting SPO2 at 95% on RA with RN notified. Pt attempts to stand from chair with BUE's on RW despite cuing causing pt to display post Lob requiring minA from PT to correct. PT educated pt on appropriate and safe way to stand to RW with pt performing x2 with significant improvement in ant weight shift and stability without LOB and pt verbalizing understanding. Returned to bed with minguard. Pt will benefit from [facility] PT to address mobility, strength, endurance, use of DME deficits in home environment. Pt currently with functional limitations due to the deficits listed below (see PT Problem List). Pt will benefit from skilled PT to increase their independence and safety with mobility to allow discharge to the venue listed below.     Recommendations for follow up therapy are one component of a multi-disciplinary discharge  planning process, led by the attending physician.  Recommendations may be updated based on patient status, additional functional criteria and insurance authorization.  Follow Up Recommendations Home health PT    Assistance Recommended at Discharge Intermittent Supervision/Assistance  Functional Status Assessment Patient has had a recent decline in their functional status and demonstrates the ability to make significant improvements in function in a reasonable and predictable amount of time.  Equipment Recommendations  Rolling walker (2 wheels)    Recommendations for Other Services       Precautions / Restrictions Precautions Precautions: Fall Restrictions Weight Bearing Restrictions: No      Mobility  Bed Mobility Overal bed mobility: Needs Assistance Bed Mobility: Supine to Sit;Sit to Supine     Supine to sit: Min assist;HOB elevated Sit to supine: Min guard   General bed mobility comments: Requires truncal support ro sit at EOB Patient Response: Flat affect;Cooperative  Transfers Overall transfer level: Needs assistance Equipment used: Rolling walker (2 wheels) Transfers: Sit to/from Stand Sit to Stand: Min guard           General transfer comment: Requires green 4" step due to height of ED bed. Cuing for hand placement.    Ambulation/Gait Ambulation/Gait assistance: Min guard Gait Distance (Feet): 30 Feet Assistive device: Rolling walker (2 wheels) Gait Pattern/deviations: Step-through pattern;Wide base of support Gait velocity: decreased        Stairs            Wheelchair Mobility    Modified Rankin (Stroke Patients Only)  Balance Overall balance assessment: Needs assistance Sitting-balance support: No upper extremity supported;Feet supported Sitting balance-Leahy Scale: Fair       Standing balance-Leahy Scale: Good Standing balance comment: Able to stand without RW support and assist in raising hospital gown for RN to assist in  pt cleaning                             Pertinent Vitals/Pain Pain Assessment: No/denies pain    Home Living Family/patient expects to be discharged to:: Group home                        Prior Function Prior Level of Function : Needs assist       Physical Assist : Mobility (physical);ADLs (physical)   ADLs (physical): Feeding;Grooming;Bathing;Dressing;Toileting;IADLs Mobility Comments: AMb with no device at baseline, but always has staff member with her outside of group home. ADLs Comments: Reports group home staff assist with all ADL's/IADLs     Hand Dominance        Extremity/Trunk Assessment   Upper Extremity Assessment Upper Extremity Assessment: Overall WFL for tasks assessed    Lower Extremity Assessment Lower Extremity Assessment: Generalized weakness    Cervical / Trunk Assessment Cervical / Trunk Assessment: Normal  Communication   Communication: No difficulties  Cognition Arousal/Alertness: Awake/alert Behavior During Therapy: Flat affect Overall Cognitive Status: Within Functional Limits for tasks assessed                                 General Comments: Pleasant and cooperative        General Comments General comments (skin integrity, edema, etc.): INitially on 1L [place] satting at 98%. Trialed RA. Maintained SPO2 > 90% on RA with ambulation.    Exercises Other Exercises Other Exercises: Role of PT in acute setting, D/c and DME recs.   Assessment/Plan    PT Assessment Patient needs continued PT services  PT Problem List Decreased strength;Decreased mobility;Decreased safety awareness;Decreased activity tolerance;Decreased balance;Decreased knowledge of use of DME       PT Treatment Interventions DME instruction;Therapeutic exercise;Gait training;Balance training;Stair training;Neuromuscular re-education;Functional mobility training;Therapeutic activities;Patient/family education    PT Goals (Current goals can  be found in the Care Plan section)  Acute Rehab PT Goals Patient Stated Goal: return to group home PT Goal Formulation: With patient Time For Goal Achievement: 08/09/21 Potential to Achieve Goals: Good    Frequency Min 2X/week   Barriers to discharge   stairs to enter group home    Co-evaluation               AM-PAC PT "6 Clicks" Mobility  Outcome Measure Help needed turning from your back to your side while in a flat bed without using bedrails?: A Little Help needed moving from lying on your back to sitting on the side of a flat bed without using bedrails?: A Little Help needed moving to and from a bed to a chair (including a wheelchair)?: A Little Help needed standing up from a chair using your arms (e.g., wheelchair or bedside chair)?: A Little Help needed to walk in hospital room?: A Little Help needed climbing 3-5 steps with a railing? : A Lot 6 Click Score: 17    End of Session Equipment Utilized During Treatment: Gait belt Activity Tolerance: Patient tolerated treatment well Patient left: in bed;with call bell/phone within reach Nurse  Communication: Mobility status PT Visit Diagnosis: Other abnormalities of gait and mobility (R26.89);Muscle weakness (generalized) (M62.81)    Time: 3335-4562 PT Time Calculation (min) (ACUTE ONLY): 30 min   Charges:   PT Evaluation $PT Eval Low Complexity: 1 Low PT Treatments $Therapeutic Activity: 23-37 mins        Jena Tegeler M. Fairly IV, PT, DPT Physical Therapist- [place]  [facility]  07/26/2021, 11:32 AM

## 2021-07-26 NOTE — ED Notes (Signed)
Dr. Allena Katz notified about Pt abnormal EKG. Dr. Allena Katz at bedside

## 2021-07-26 NOTE — ED Notes (Addendum)
Spoke with Brooke Bonito, [phone] staff at patient's Group Home, [facility], on update on pt's status.   Joneen Roach [phone] [facility] Director

## 2021-07-27 LAB — CBC
HCT: 36.4 % (ref 36.0–46.0)
Hemoglobin: 11.5 g/dL — ABNORMAL LOW (ref 12.0–15.0)
MCH: 29 pg (ref 26.0–34.0)
MCHC: 31.6 g/dL (ref 30.0–36.0)
MCV: 91.7 fL (ref 80.0–100.0)
Platelets: 187 10*3/uL (ref 150–400)
RBC: 3.97 MIL/uL (ref 3.87–5.11)
RDW: 13.3 % (ref 11.5–15.5)
WBC: 5 10*3/uL (ref 4.0–10.5)
nRBC: 0 % (ref 0.0–0.2)

## 2021-07-27 LAB — BASIC METABOLIC PANEL
Anion gap: 5 (ref 5–15)
BUN: 14 mg/dL (ref 8–23)
CO2: 32 mmol/L (ref 22–32)
Calcium: 8.5 mg/dL — ABNORMAL LOW (ref 8.9–10.3)
Chloride: 101 mmol/L (ref 98–111)
Creatinine, Ser: 0.84 mg/dL (ref 0.44–1.00)
GFR, Estimated: >60 mL/min (ref >60)
Glucose, Bld: 96 mg/dL (ref 70–99)
Potassium: 3.7 mmol/L (ref 3.5–5.1)
Sodium: 138 mmol/L (ref 135–145)

## 2021-07-27 MED ORDER — CIPROFLOXACIN HCL 500 MG PO TABS: 500.0000 mg | ORAL_TABLET | Freq: Two times a day (BID) | ORAL | Status: DC

## 2021-07-27 MED ORDER — OSELTAMIVIR PHOSPHATE 75 MG PO CAPS: 75.0000 mg | ORAL_CAPSULE | Freq: Two times a day (BID) | ORAL | 0 refills | Status: AC

## 2021-07-27 MED ORDER — CIPROFLOXACIN HCL 500 MG PO TABS: 500.0000 mg | ORAL_TABLET | Freq: Two times a day (BID) | ORAL | 0 refills | Status: AC

## 2021-07-27 NOTE — ED Notes (Signed)
Pt resting with eyes closed, will continue to monitor.

## 2021-07-27 NOTE — Discharge Instructions (Signed)
Pt to f/u with her PCP in 7-10 days

## 2021-07-27 NOTE — Discharge Summary (Addendum)
[facility] NAME: Debra Rich    MR#:  [id number]  DATE OF BIRTH:  Nov 02, 1955  DATE OF ADMISSION:  07/24/2021 ADMITTING PHYSICIAN: Athena Masse, MD  DATE OF DISCHARGE: 07/27/2021  PRIMARY CARE PHYSICIAN: System, [name]    ADMISSION DIAGNOSIS:  Influenza A [J10.1]  DISCHARGE DIAGNOSIS:  Sepsis due to Influenza A and Klebsiella UTI  SECONDARY DIAGNOSIS:   Past Medical History:  Diagnosis Date   Bipolar 1 disorder ([name])    GERD (gastroesophageal reflux disease)    HLD (hyperlipidemia)    HTN (hypertension)    TBI (traumatic brain injury)     HOSPITAL COURSE:  65 yo F with hx HTN, hx TBI, hx bipolar, hx seizure like activity (Dr. Melrose Nakayama neurology note from 06/2020) who was sent from group home with AMS.  At baseline she's ambulatory, talkative, and interactive.  She's become less so in the last few days.  She was admitted for sepsis from influenza and UTI.   Acute hypoxic respiratory failure secondary to influenza -- patient saturations currently on room air 95 to 97% -- continue Tamiflu -- PRN nebulizer if needed --sats >92% on RA   sepsis secondary to influenza and UTI -- patient came in with fever tachypnea and abnormal urine -- patient unable to give me history of dysuria. -- Urine quite abnormal -- currently on IV Rocephin will change to oral once culture results back --UC klebseilla--change to po cipro x 7days --sepsis resolved   Hypertension -- continue losartan and amlodipine   history of bipolar disorder  history of traumatic brain injury -- patient is from group home -- continue her benztropine, Depakote, Risperdal  Abnromal tele rhythm --seen by Dr [name] like artifact. D/ced amiodarone gtt --pt is in SR   Procedures: Family communication :Tourist information centre manager Consults:none CODE STATUS: FULL DVT Prophylaxis :enoxaparin  Pt overall is at baseline and ok to d/c to  her group home    CONSULTS OBTAINED:    DRUG ALLERGIES:   Allergies  Allergen Reactions   Penicillins     DISCHARGE MEDICATIONS:   Allergies as of 07/27/2021       Reactions   Penicillins         Medication List     TAKE these medications    acetaminophen 500 MG tablet Commonly known as: TYLENOL Take 1,000 mg by mouth every 8 (eight) hours as needed for pain or fever.   amLODipine 10 MG tablet Commonly known as: NORVASC Take 10 mg by mouth daily.   benztropine 0.5 MG tablet Commonly known as: COGENTIN Take 0.5 mg by mouth 2 (two) times daily.   Cholecalciferol 25 MCG (1000 UT) tablet Take 2,000 Units by mouth daily.   ciprofloxacin 500 MG tablet Commonly known as: CIPRO Take 1 tablet (500 mg total) by mouth 2 (two) times daily for 6 days.   divalproex 500 MG DR tablet Commonly known as: DEPAKOTE Take 1,000 mg by mouth at bedtime.   loratadine 10 MG tablet Commonly known as: CLARITIN Take 10 mg by mouth daily.   LORazepam 1 MG tablet Commonly known as: ATIVAN Take 1 mg by mouth at bedtime.   losartan 100 MG tablet Commonly known as: COZAAR Take 100 mg by mouth daily.   meloxicam 7.5 MG tablet Commonly known as: MOBIC Take 7.5 mg by mouth daily as needed for pain.   metFORMIN 500 MG tablet Commonly known as: GLUCOPHAGE Take 500 mg by mouth  in the morning.   omeprazole 20 MG capsule Commonly known as: PRILOSEC Take 20 mg by mouth daily.   oseltamivir 75 MG capsule Commonly known as: TAMIFLU Take 1 capsule (75 mg total) by mouth 2 (two) times daily for 2 days.   oxybutynin 10 MG 24 hr tablet Commonly known as: DITROPAN-XL Take 10 mg by mouth daily.   pravastatin 40 MG tablet Commonly known as: PRAVACHOL Take 40 mg by mouth daily.   risperiDONE 1 MG tablet Commonly known as: RISPERDAL Take 1 mg by mouth 2 (two) times daily. (Take with 4mg  tablets to equal 5mg  total)   risperidone 4 MG tablet Commonly known as: RISPERDAL Take 4 mg  by mouth 2 (two) times daily. (Take with 1mg  tablets to equal 5mg  total)   Therems-M Tabs Take 1 tablet by mouth daily.   vitamin E 180 MG (400 UNITS) capsule Take 400 Units by mouth daily.        If you experience worsening of your admission symptoms, develop shortness of breath, life threatening emergency, suicidal or homicidal thoughts you must seek medical attention immediately by calling 911 or calling your MD immediately  if symptoms less severe.  You Must read complete instructions/literature along with all the possible adverse reactions/side effects for all the Medicines you take and that have been prescribed to you. Take any new Medicines after you have completely understood and accept all the possible adverse reactions/side effects.   Please note  You were cared for by a hospitalist during your hospital stay. If you have any questions about your discharge medications or the care you received while you were in the hospital after you are discharged, you can call the unit and asked to speak with the hospitalist on call if the hospitalist that took care of you is not available. Once you are discharged, your primary care physician will handle any further medical issues. Please note that NO REFILLS for any discharge medications will be authorized once you are discharged, as it is imperative that you return to your primary care physician (or establish a relationship with a primary care physician if you do not have one) for your aftercare needs so that they can reassess your need for medications and monitor your lab values. Today   SUBJECTIVE   No new complaints. Remains in SR No new issues per RN  VITAL SIGNS:  Blood pressure 112/60, pulse 79, temperature 97.7 F (36.5 C), resp. rate 11, height 5\' 3"  (1.6 m), weight 99.8 kg, SpO2 94 %.  I/O:   Intake/Output Summary (Last 24 hours) at 07/27/2021 1041 Last data filed at 07/27/2021 0145 Gross per 24 hour  Intake 100 ml  Output 900  ml  Net -800 ml    PHYSICAL EXAMINATION:  GENERAL:  65 y.o.-year-old patient lying in the bed with no acute distress. Obese LUNGS: Normal breath sounds bilaterally, no wheezing, rales,rhonchi or crepitation. No use of accessory muscles of respiration.  CARDIOVASCULAR: S1, S2 normal. No murmurs, rubs, or gallops.  ABDOMEN: Soft, non-tender, non-distended. Bowel sounds present. No organomegaly or mass.  EXTREMITIES: No pedal edema, cyanosis, or clubbing.  NEUROLOGIC: non-focal PSYCHIATRIC:  patient is alert and oriented x 3.  SKIN: No obvious rash, lesion, or ulcer.   DATA REVIEW:   CBC  Recent Labs  Lab 07/27/21 0609  WBC 5.0  HGB 11.5*  HCT 36.4  PLT 187    Chemistries  Recent Labs  Lab 07/26/21 0625 07/27/21 0609  NA 139 138  K 3.6  3.7  CL 102 101  CO2 30 32  GLUCOSE 109* 96  BUN 12 14  CREATININE 0.58 0.84  CALCIUM 8.7* 8.5*  MG 2.0  --   AST 68*  --   ALT 37  --   ALKPHOS 42  --   BILITOT 0.4  --     Microbiology Results   Recent Results (from the past 240 hour(s))  Culture, blood (Routine x 2)     Status: None (Preliminary result)   Collection Time: 07/24/21  8:15 PM   Specimen: BLOOD LEFT FOREARM  Result Value Ref Range Status   Specimen Description BLOOD LEFT FOREARM  Final   Special Requests   Final    BOTTLES DRAWN AEROBIC AND ANAEROBIC Blood Culture results may not be optimal due to an excessive volume of blood received in culture bottles   Culture   Final    NO GROWTH 2 DAYS Performed at [facility], [address] [place], [place] [address]    Report Status PENDING  Incomplete  Culture, blood (Routine x 2)     Status: None (Preliminary result)   Collection Time: 07/24/21  8:15 PM   Specimen: BLOOD  Result Value Ref Range Status   Specimen Description BLOOD BLOOD LEFT FOREARM  Final   Special Requests   Final    BOTTLES DRAWN AEROBIC AND ANAEROBIC Blood Culture results may not be optimal due to an excessive volume of blood  received in culture bottles   Culture   Final    NO GROWTH 2 DAYS Performed at [facility], [address] [place], [place] [address]    Report Status PENDING  Incomplete  Resp Panel by RT-PCR (Flu A&B, Covid) Nasopharyngeal Swab     Status: Abnormal   Collection Time: 07/24/21  8:15 PM   Specimen: Nasopharyngeal Swab; [name]    Comment: (NOTE) SARS-CoV-2 target nucleic acids are NOT DETECTED.  The SARS-CoV-2 RNA is generally detectable in upper respiratory specimens during the acute phase of infection. The lowest concentration of SARS-CoV-2 viral copies this assay can detect is 138 copies/mL. A negative result does not preclude SARS-Cov-2 infection and should not be used as the sole basis for treatment or other patient management decisions. A negative result may occur with  improper specimen collection/handling, submission of specimen other than nasopharyngeal swab, presence of viral mutation(s) within the areas targeted by this assay, and inadequate number of viral copies(<138 copies/mL). A negative result must be combined with clinical observations, patient history, and epidemiological information. The expected result is Negative.  Fact Sheet for Patients:  EntrepreneurPulse.com.au  Fact Sheet for Healthcare Providers:  IncredibleEmployment.be  This test is no t yet approved or cleared by the [place] FDA and  has been authorized for detection and/or diagnosis of SARS-CoV-2 by FDA under an Emergency Use Authorization (EUA). This EUA will remain  in effect (meaning this test can be used) for the duration of the COVID-19 declaration under Section 564(b)(1) of the Act, 21 U.S.C.section 360bbb-3(b)(1), unless the authorization is terminated  or revoked sooner.       Influenza A by PCR POSITIVE (A)  NEGATIVE Final   Influenza B by PCR NEGATIVE NEGATIVE Final    Comment: (NOTE) The Xpert Xpress SARS-CoV-2/FLU/RSV plus assay is intended as an aid in the diagnosis of influenza from Nasopharyngeal swab specimens and should not be  used as a sole basis for treatment. Nasal washings and aspirates are unacceptable for Xpert Xpress SARS-CoV-2/FLU/RSV testing.  Fact Sheet for Patients: EntrepreneurPulse.com.au  Fact Sheet for Healthcare Providers: IncredibleEmployment.be  This test is not yet approved or cleared by the [place] FDA and has been authorized for detection and/or diagnosis of SARS-CoV-2 by FDA under an Emergency Use Authorization (EUA). This EUA will remain in effect (meaning this test can be used) for the duration of the COVID-19 declaration under Section 564(b)(1) of the Act, 21 U.S.C. section 360bbb-3(b)(1), unless the authorization is terminated or revoked.  Performed at [facility], [address], [place], [place] [address]   Urine Culture     Status: Abnormal (Preliminary result)   Collection Time: 07/24/21 11:44 PM   Specimen: In/Out Cath Urine  Result Value Ref Range Status   Specimen Description   Final    IN/OUT CATH URINE Performed at [facility], [address], [place], [place] [address]    Special Requests   Final    NONE Performed at [facility], [address], [place], [place] [address]    Culture (A)  Final    >=100,000 COLONIES/mL KLEBSIELLA PNEUMONIAE REPEATING SENSITIVITY Performed at [facility], [place] [address], [place], [place] [address]    Report Status PENDING  Incomplete    RADIOLOGY:  ECHOCARDIOGRAM COMPLETE  Result Date: 07/26/2021    ECHOCARDIOGRAM REPORT   Patient Name:   ROCHELE PHILSON Date of Exam: 07/26/2021 Medical Rec #:  [id number]   Height:       63.0 in Accession #:    [id number]  Weight:       220.0 lb Date of Birth:  1956/06/06  BSA:           2.014 m Patient Age:    27 years    BP:           107/62 mmHg Patient Gender: F           HR:           Not listed in chart bpm. Exam Location:  [facility] Procedure: 2D Echo, Cardiac Doppler and Color Doppler Indications:     Abnormal ECG R94.31  History:         Patient has no prior history of Echocardiogram examinations.                  Risk Factors:Hypertension and Dyslipidemia.  Sonographer:     Sherrie Sport Referring Phys:  [id number] Jasen Hartstein Diagnosing Phys: Kathlyn Sacramento MD  Sonographer Comments: Suboptimal apical window and no subcostal window. IMPRESSIONS  1. Left ventricular ejection fraction, by estimation, is 55 to 60%. The left ventricle has normal function. Left ventricular endocardial border not optimally defined to evaluate regional wall motion. There is mild left ventricular hypertrophy. Left ventricular diastolic parameters were normal.  2. Right ventricular systolic function is normal. The right ventricular size is normal. Tricuspid regurgitation signal is inadequate for assessing PA pressure.  3. The mitral valve is normal in structure. No evidence of mitral valve regurgitation. No evidence of mitral stenosis.  4. The aortic valve is normal in structure. Aortic valve regurgitation is not visualized. No aortic stenosis is present. FINDINGS  Left Ventricle: Left ventricular ejection fraction, by estimation, is 55 to 60%. The left ventricle has normal function. Left ventricular endocardial border not optimally defined to evaluate regional wall motion. The left ventricular internal cavity size was normal in size. There is mild left  ventricular hypertrophy. Left ventricular diastolic parameters were normal. Right Ventricle: The right ventricular size is normal. No increase in right ventricular wall thickness. Right ventricular systolic function is normal. Tricuspid regurgitation signal is inadequate for assessing PA pressure. Left Atrium: Left atrial size was normal in size. Right Atrium: Right atrial size  was normal in size. Pericardium: There is no evidence of pericardial effusion. Mitral Valve: The mitral valve is normal in structure. No evidence of mitral valve regurgitation. No evidence of mitral valve stenosis. MV peak gradient, 4.8 mmHg. The mean mitral valve gradient is 2.0 mmHg. Tricuspid Valve: The tricuspid valve is normal in structure. Tricuspid valve regurgitation is not demonstrated. No evidence of tricuspid stenosis. Aortic Valve: The aortic valve is normal in structure. Aortic valve regurgitation is not visualized. No aortic stenosis is present. Aortic valve mean gradient measures 2.0 mmHg. Aortic valve peak gradient measures 3.8 mmHg. Aortic valve area, by VTI measures 2.37 cm. Pulmonic Valve: The pulmonic valve was normal in structure. Pulmonic valve regurgitation is not visualized. No evidence of pulmonic stenosis. Aorta: The aortic root is normal in size and structure. Venous: The inferior vena cava was not well visualized. IAS/Shunts: No atrial level shunt detected by color flow Doppler.  LEFT VENTRICLE PLAX 2D LVIDd:         4.80 cm   Diastology LVIDs:         2.90 cm   LV e' medial:    5.55 cm/s LV PW:         1.20 cm   LV E/e' medial:  16.0 LV IVS:        0.90 cm   LV e' lateral:   9.90 cm/s LVOT diam:     2.00 cm   LV E/e' lateral: 9.0 LV SV:         54 LV SV Index:   27 LVOT Area:     3.14 cm  RIGHT VENTRICLE RV Basal diam:  3.50 cm RV S prime:     13.40 cm/s TAPSE (M-mode): 3.2 cm LEFT ATRIUM             Index        RIGHT ATRIUM           Index LA diam:        2.20 cm 1.09 cm/m   RA Area:     17.00 cm LA Vol (A2C):   42.7 ml 21.21 ml/m  RA Volume:   44.90 ml  22.30 ml/m LA Vol (A4C):   36.8 ml 18.28 ml/m LA Biplane Vol: 41.2 ml 20.46 ml/m  AORTIC VALVE                    PULMONIC VALVE AV Area (Vmax):    2.56 cm     PV Vmax:        0.67 m/s AV Area (Vmean):   2.46 cm     PV Vmean:       45.550 cm/s AV Area (VTI):     2.37 cm     PV VTI:         0.147 m AV Vmax:           97.60  cm/s   PV Peak grad:   1.8 mmHg AV Vmean:          69.500 cm/s  PV Mean grad:   1.0 mmHg AV VTI:            0.227 m  RVOT Peak grad: 3 mmHg AV Peak Grad:      3.8 mmHg AV Mean Grad:      2.0 mmHg LVOT Vmax:         79.60 cm/s LVOT Vmean:        54.400 cm/s LVOT VTI:          0.171 m LVOT/AV VTI ratio: 0.75  AORTA Ao Root diam: 3.30 cm MITRAL VALVE               TRICUSPID VALVE MV Area (PHT): 4.86 cm    TR Peak grad:   13.8 mmHg MV Area VTI:   1.85 cm    TR Vmax:        186.00 cm/s MV Peak grad:  4.8 mmHg MV Mean grad:  2.0 mmHg    SHUNTS MV Vmax:       1.09 m/s    Systemic VTI:  0.17 m MV Vmean:      57.4 cm/s   Systemic Diam: 2.00 cm MV Decel Time: 156 msec    Pulmonic VTI:  0.157 m MV E velocity: 88.70 cm/s MV A velocity: 75.00 cm/s MV E/A ratio:  1.18 Kathlyn Sacramento MD Electronically signed by Kathlyn Sacramento MD Signature Date/Time: 07/26/2021/1:29:32 PM    Final      CODE STATUS:     Code Status Orders  (From admission, onward)           Start     Ordered   07/25/21 0158  Full code  Continuous        07/25/21 0158           Code Status History     Date Active Date Inactive Code Status Order ID Comments User Context   11/09/2020 1833 11/18/2020 1848 Full Code [id number]  Ivor Costa, MD ED        TOTAL TIME TAKING CARE OF THIS PATIENT: 40 minutes.    Fritzi Mandes M.D  [facility]    CC: Primary care physician; System, [name]

## 2021-07-28 LAB — URINE CULTURE: Culture: >=100000 — AB

## 2021-07-29 LAB — CULTURE, BLOOD (ROUTINE X 2)
Culture: NO GROWTH
Culture: NO GROWTH

## 2022-02-21 ENCOUNTER — Other Ambulatory Visit: Payer: Self-pay | Admitting: Internal Medicine

## 2023-01-18 IMAGING — DX DG CHEST 1V PORT
1 series · 1 of 1 positions shown · non-contrast
Comparison: 11/09/2020

CLINICAL DATA: 64-year-old female with leukocytosis.

EXAM:
PORTABLE CHEST 1 VIEW

[chest ap]
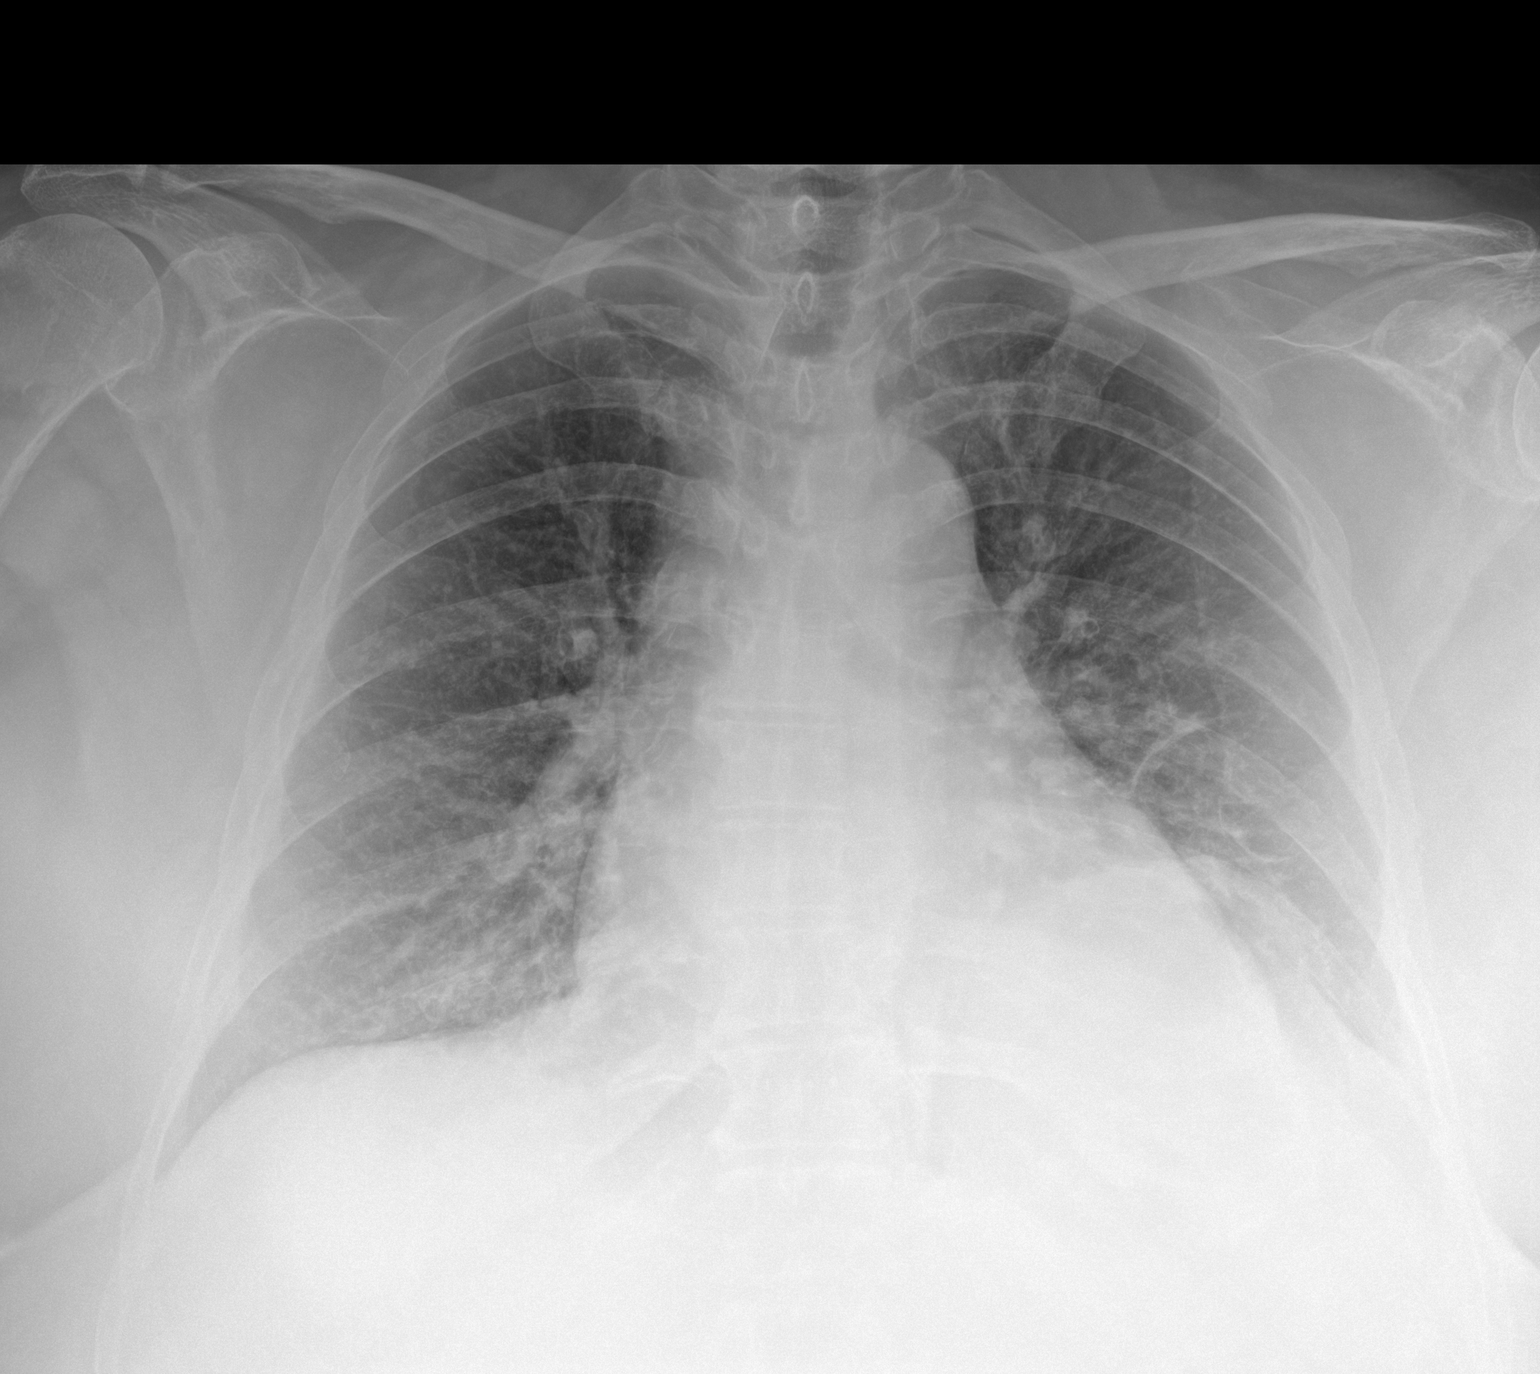

[1 of 1 positions shown; findings below may reference images not displayed]

FINDINGS: Unchanged mild cardiomegaly. The cardiomediastinal silhouette is
otherwise within normal limits. Both lungs are clear. The visualized
skeletal structures are unremarkable.
IMPRESSION: No acute cardiopulmonary process.  Unchanged mild cardiomegaly.

## 2023-09-24 IMAGING — CR DG CHEST 2V
2 series · 2 of 2 positions shown · non-contrast
Comparison: November 17, 2020

CLINICAL DATA: Altered mental status and shortness of breath.

EXAM:
CHEST - 2 VIEW

[chest ap]
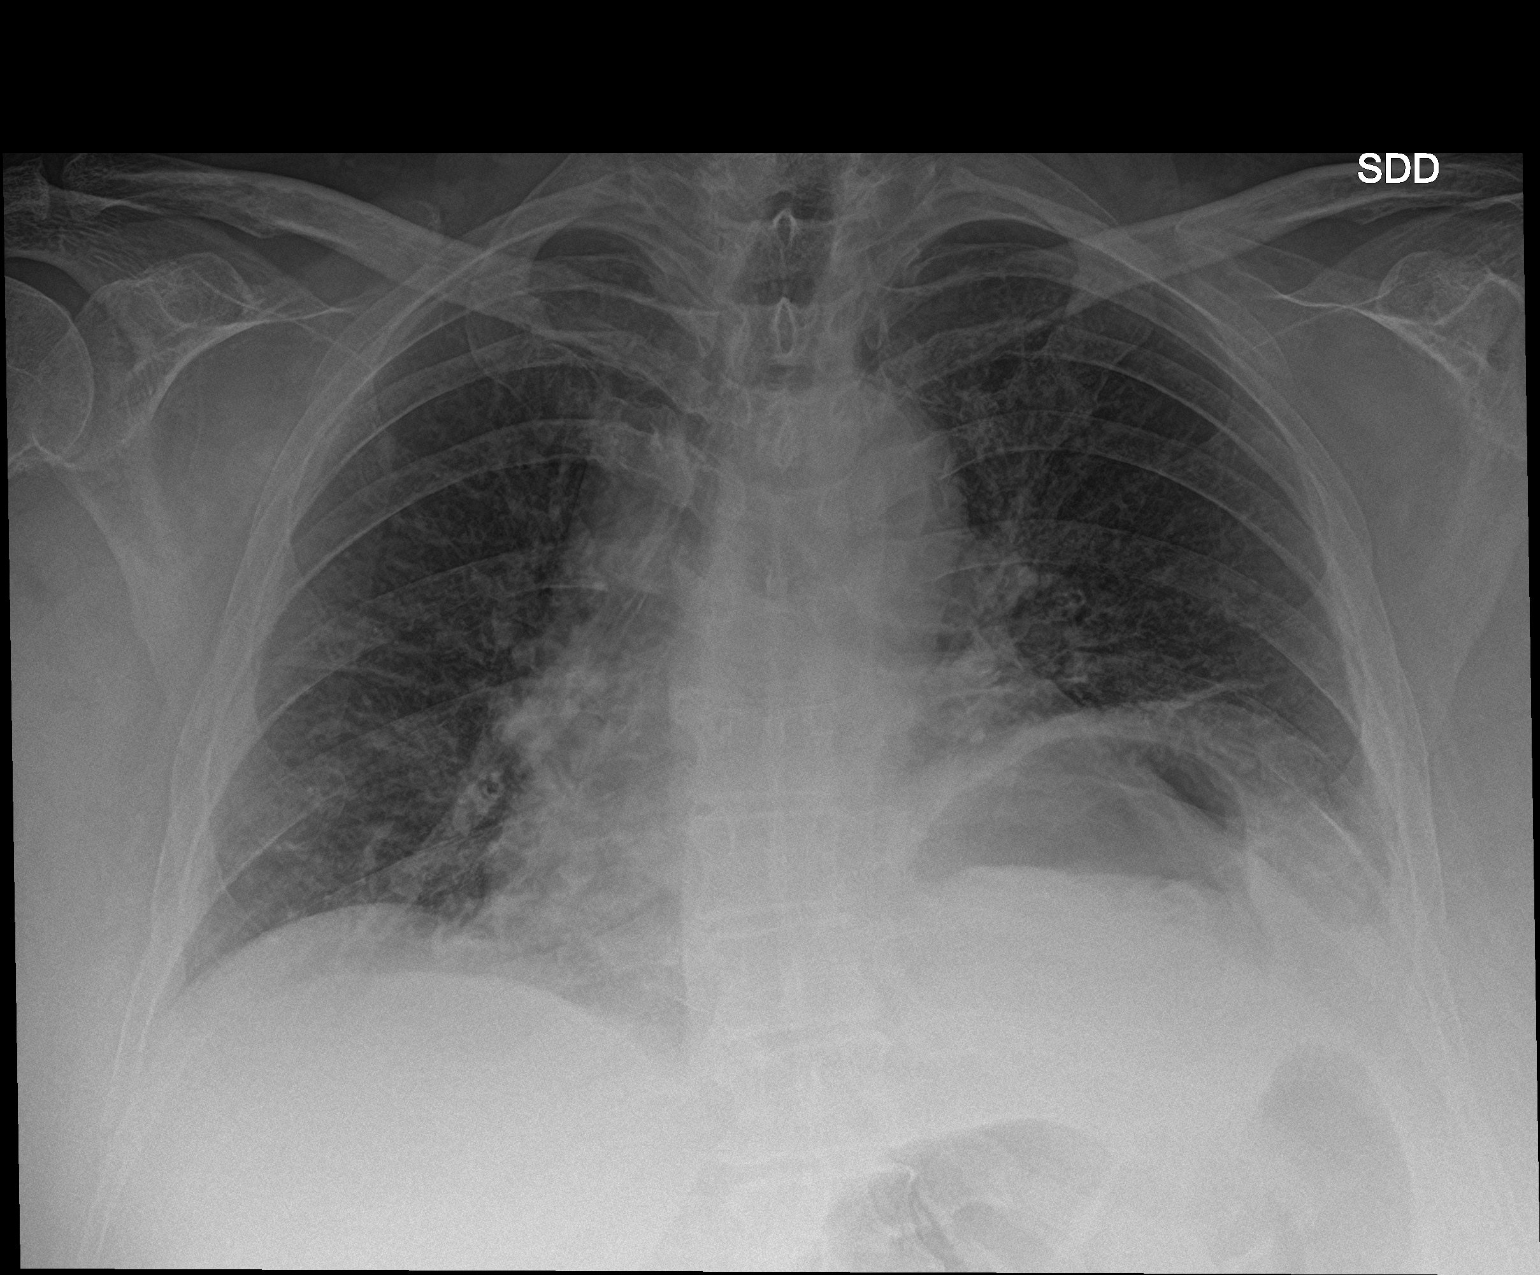

[chest lat]
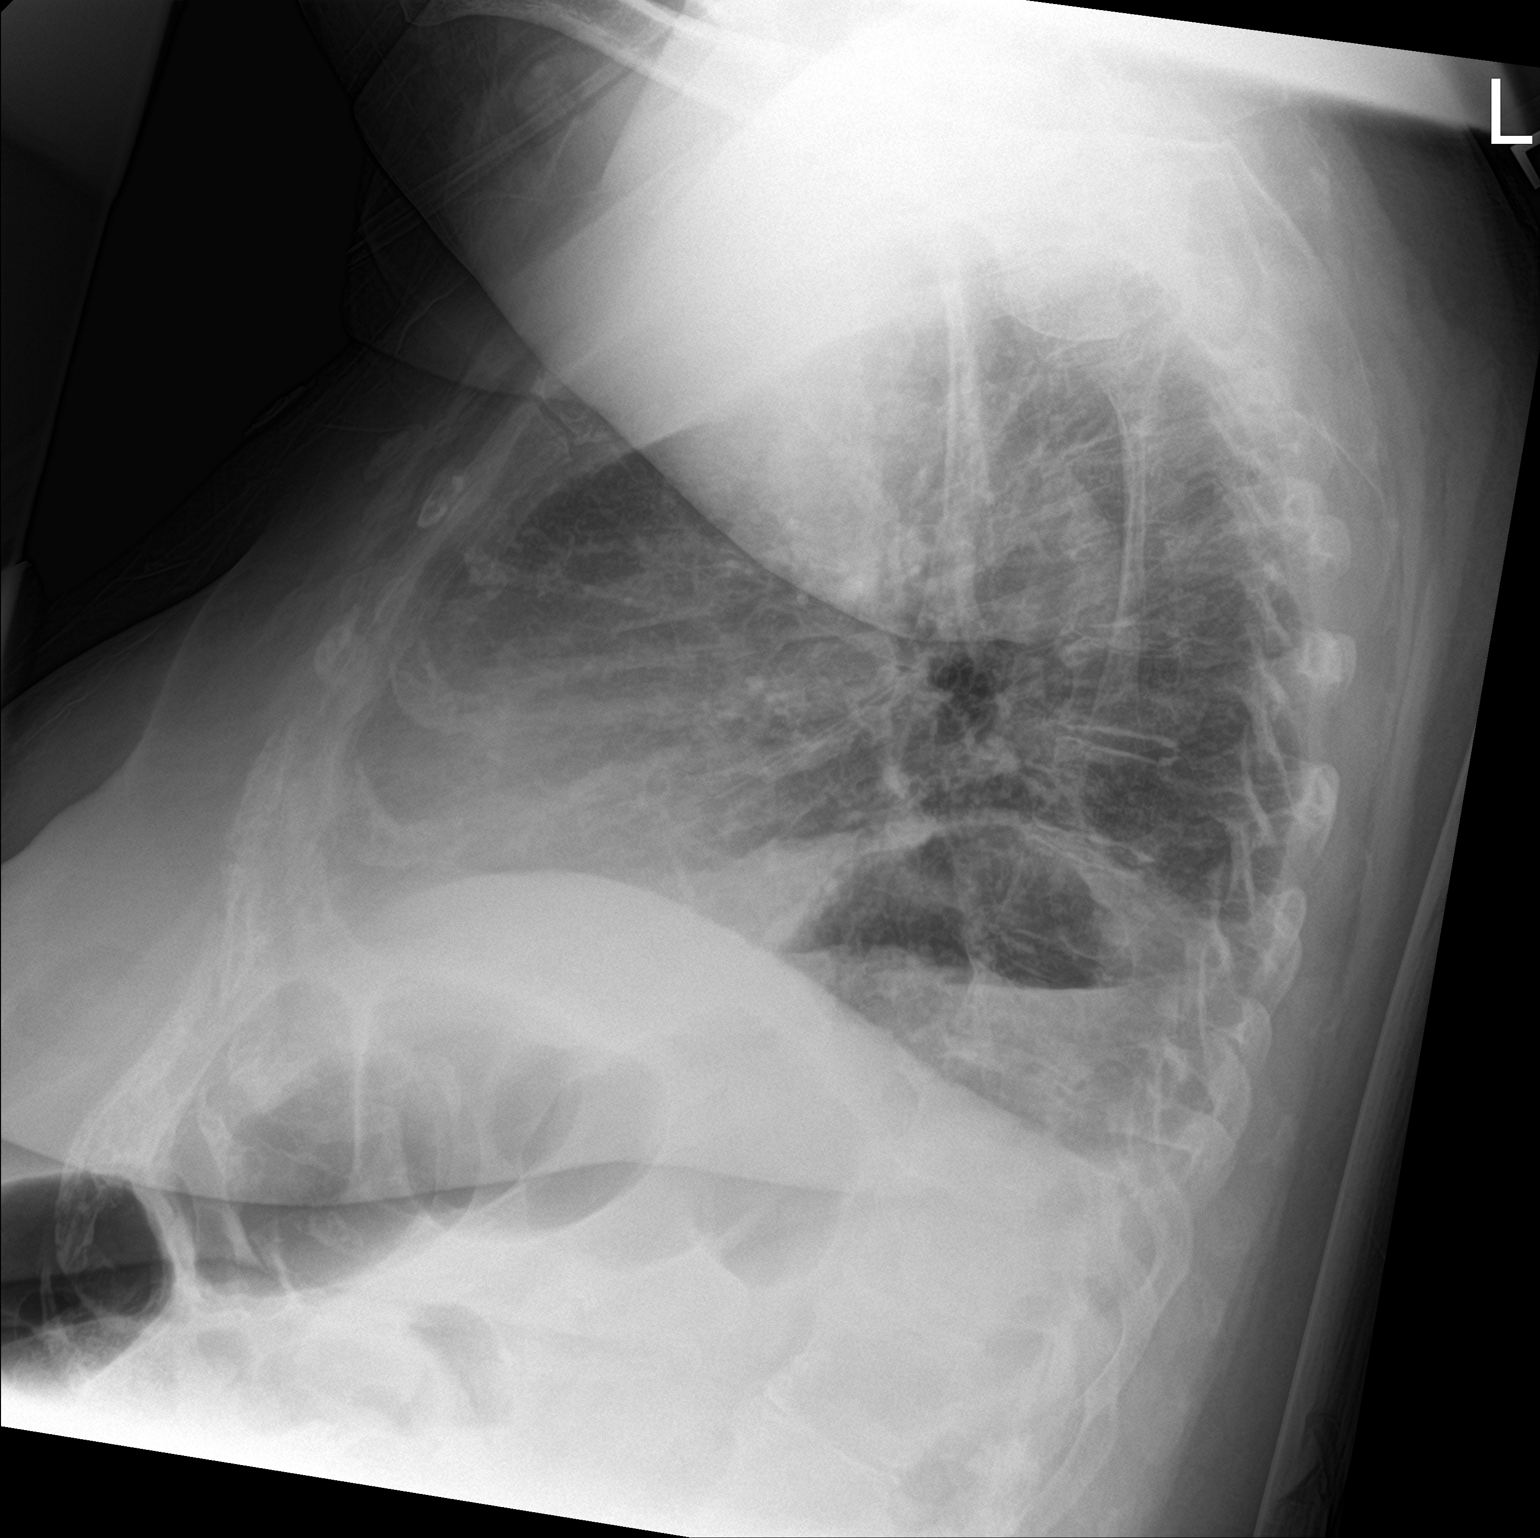

[2 of 2 positions shown; findings below may reference images not displayed]

FINDINGS: Mild, diffuse, chronic appearing increased interstitial lung
markings are seen. Mild atelectasis is noted within the bilateral
lung bases. There is no evidence of a pneumothorax. A small left
pleural effusion is seen. There is mild, stable elevation of the
left hemidiaphragm. Mild, stable cardiomegaly is seen. The
visualized skeletal structures are unremarkable.
IMPRESSION: 1. Chronic appearing increased interstitial lung markings.
2. Mild bibasilar atelectasis.
3. Small left pleural effusion.

## 2024-08-22 ENCOUNTER — Other Ambulatory Visit: Payer: Self-pay

## 2024-08-22 ENCOUNTER — Emergency Department

## 2024-08-22 ENCOUNTER — Emergency Department
Admission: EM | Admit: 2024-08-22 | Discharge: 2024-08-22 | Disposition: A | Discharge status: Home / Self Care | Attending: Emergency Medicine | Admitting: Emergency Medicine

## 2024-08-22 DIAGNOSIS — J101 Influenza due to other identified influenza virus with other respiratory manifestations: Secondary | ICD-10-CM | POA: Insufficient documentation

## 2024-08-22 DIAGNOSIS — I10 Essential (primary) hypertension: Secondary | ICD-10-CM | POA: Insufficient documentation

## 2024-08-22 DIAGNOSIS — R509 Fever, unspecified: Secondary | ICD-10-CM | POA: Diagnosis present

## 2024-08-22 LAB — COMPREHENSIVE METABOLIC PANEL WITH GFR
ALT: 29 U/L (ref 0–44)
AST: 105 U/L — ABNORMAL HIGH (ref 15–41)
Albumin: 3.9 g/dL (ref 3.5–5.0)
Alkaline Phosphatase: 72 U/L (ref 38–126)
Anion gap: 12 (ref 5–15)
BUN: 19 mg/dL (ref 8–23)
CO2: 26 mmol/L (ref 22–32)
Calcium: 9.2 mg/dL (ref 8.9–10.3)
Chloride: 103 mmol/L (ref 98–111)
Creatinine, Ser: 1.06 mg/dL — ABNORMAL HIGH (ref 0.44–1.00)
GFR, Estimated: 58 mL/min — ABNORMAL LOW
Glucose, Bld: 98 mg/dL (ref 70–99)
Potassium: 3.6 mmol/L (ref 3.5–5.1)
Sodium: 141 mmol/L (ref 135–145)
Total Bilirubin: 0.3 mg/dL (ref 0.0–1.2)
Total Protein: 6.3 g/dL — ABNORMAL LOW (ref 6.5–8.1)

## 2024-08-22 LAB — CBC
HCT: 35.7 % — ABNORMAL LOW (ref 36.0–46.0)
Hemoglobin: 11.8 g/dL — ABNORMAL LOW (ref 12.0–15.0)
MCH: 31.1 pg (ref 26.0–34.0)
MCHC: 33.1 g/dL (ref 30.0–36.0)
MCV: 93.9 fL (ref 80.0–100.0)
Platelets: 189 K/uL (ref 150–400)
RBC: 3.8 MIL/uL — ABNORMAL LOW (ref 3.87–5.11)
RDW: 12.7 % (ref 11.5–15.5)
WBC: 6.3 K/uL (ref 4.0–10.5)
nRBC: 0 % (ref 0.0–0.2)

## 2024-08-22 LAB — RESP PANEL BY RT-PCR (RSV, FLU A&B, COVID)  RVPGX2
Influenza A by PCR: POSITIVE — AB
Influenza B by PCR: NEGATIVE
Resp Syncytial Virus by PCR: NEGATIVE
SARS Coronavirus 2 by RT PCR: NEGATIVE

## 2024-08-22 MED ORDER — OSELTAMIVIR PHOSPHATE 75 MG PO CAPS: 75.0000 mg | ORAL_CAPSULE | Freq: Two times a day (BID) | ORAL | 0 refills | Status: AC

## 2024-08-22 NOTE — ED Triage Notes (Addendum)
 Pt to ED via [facility] from [facility]. Pt reports has been fighting a cold x5 days. Pt reports the facility was concerned today because pt was more weak than usual. Pt reports cough, congestion and HA.   Pt denies SOB. Pt RA sats 89% and placed on 2L [place].

## 2024-08-22 NOTE — ED Notes (Signed)
 Pt Dc'd with caregiver from [facility]. DC instructions given to caregiver. Verbalized understanding of follow up care. Pt ambulatory from ED with caregiver without difficulty.

## 2024-08-22 NOTE — ED Notes (Signed)
 Attempted to call [facility] no answer at this time. Also attempted to call Pt's mother listed in contacts still no answer.

## 2024-08-22 NOTE — ED Triage Notes (Signed)
 First nurse note: pt to ED [facility] from [facility] for generalized weakness this am. VSS

## 2024-08-22 NOTE — Discharge Instructions (Signed)
 Patient has influenza type A, if any worsening breathing please return to the emergency department

## 2024-08-22 NOTE — ED Notes (Addendum)
 This tech ambulated pt without nasal cannula lowest her sats dropped were 91% with no complaints

## 2024-08-22 NOTE — ED Provider Notes (Signed)
 "  [facility] Provider Note    Event Date/Time   First MD Initiated Contact with Patient 08/22/24 1254     (approximate)   History   Weakness   HPI  Debra Rich is a 68 y.o. female with history of bipolar disorder, hypertension, TBI who presents with signs of upper respiratory infection, weakness, sent for evaluation     Physical Exam   Triage Vital Signs: ED Triage Vitals  Encounter Vitals Group     BP 08/22/24 1136 (!) 109/57     Girls Systolic BP Percentile --      Girls Diastolic BP Percentile --      Boys Systolic BP Percentile --      Boys Diastolic BP Percentile --      Pulse Rate 08/22/24 1136 91     Resp 08/22/24 1136 20     Temp 08/22/24 1136 98.2 F (36.8 C)     Temp Source 08/22/24 1136 Oral     SpO2 08/22/24 1136 (!) 89 %     Weight --      Height --      Head Circumference --      Peak Flow --      Pain Score 08/22/24 1137 4     Pain Loc --      Pain Education --      Exclude from Growth Chart --     Most recent vital signs: Vitals:   08/22/24 1136 08/22/24 1500  BP: (!) 109/57 (!) 119/57  Pulse: 91 77  Resp: 20 17  Temp: 98.2 F (36.8 C) 98.5 F (36.9 C)  SpO2: (!) 89% 96%     General: Awake, no distress.  Pleasant, interactive CV:  Good peripheral perfusion.  Resp:  Normal effort.  No tachypnea, clear to auscultation bilaterally Abd:  No distention.  Other:     ED Results / Procedures / Treatments   Labs (all labs ordered are listed, but only abnormal results are displayed) Labs Reviewed  RESP PANEL BY RT-PCR (RSV, FLU A&B, COVID)  [id number] - Abnormal; Notable for the following components:      Result Value   Influenza A by PCR POSITIVE (*)    All other components within normal limits  CBC - Abnormal; Notable for the following components:   RBC 3.80 (*)    Hemoglobin 11.8 (*)    HCT 35.7 (*)    All other components within normal limits  COMPREHENSIVE METABOLIC PANEL WITH GFR - Abnormal; Notable for  the following components:   Creatinine, Ser 1.06 (*)    Total Protein 6.3 (*)    AST 105 (*)    GFR, Estimated 58 (*)    All other components within normal limits     EKG  ED ECG REPORT I, Lamar Price, the attending physician, personally viewed and interpreted this ECG.  Date: 08/22/2024  Rhythm: normal sinus rhythm QRS Axis: normal Intervals: normal ST/T Wave abnormalities: normal Narrative Interpretation: no evidence of acute ischemia    RADIOLOGY Chest x-ray viewed interpret by me, no acute abnormality    PROCEDURES:  Critical Care performed:   Procedures   MEDICATIONS ORDERED IN ED: Medications - No data to display   IMPRESSION / MDM / ASSESSMENT AND PLAN / ED COURSE  I reviewed the triage vital signs and the nursing notes. Patient's presentation is most consistent with acute illness / injury with system symptoms.  Patient presents with URI symptoms, reported fever suspicious for influenza  versus other URI, pneumonia  Initial pulse ox was noted is 89% however that seems to be incorrect, saturations have been above 90% in the emergency department  Chest x-ray negative for pneumonia, lab work is otherwise reassuring, influenza test is positive for influenza A.  Ambulatory pulse ox was unremarkable, with pulse oximetry staying above 90%.  No indication for admission, appropriate for discharge, Tamiflu  prescription        FINAL CLINICAL IMPRESSION(S) / ED DIAGNOSES   Final diagnoses:  Influenza A     Rx / DC Orders   ED Discharge Orders          Ordered    oseltamivir  (TAMIFLU ) 75 MG capsule  2 times daily        08/22/24 1512             Note:  This document was prepared using Dragon voice recognition software and may include unintentional dictation errors.   Arlander Charleston, MD 08/22/24 [phone]  "

## 2024-08-23 ENCOUNTER — Other Ambulatory Visit: Payer: Self-pay
